# Patient Record
Sex: Female | Born: 2000
Health system: Southern US, Community
[De-identification: ages and names within clinical notes are randomized; demographics above are authoritative.]

## PROBLEM LIST (undated history)

## (undated) DIAGNOSIS — Z720 Tobacco use: Secondary | ICD-10-CM

## (undated) DIAGNOSIS — F419 Anxiety disorder, unspecified: Secondary | ICD-10-CM

## (undated) DIAGNOSIS — F1290 Cannabis use, unspecified, uncomplicated: Secondary | ICD-10-CM

## (undated) DIAGNOSIS — A64 Unspecified sexually transmitted disease: Secondary | ICD-10-CM

## (undated) DIAGNOSIS — R002 Palpitations: Secondary | ICD-10-CM

## (undated) DIAGNOSIS — U071 COVID-19: Secondary | ICD-10-CM

## (undated) DIAGNOSIS — J45909 Unspecified asthma, uncomplicated: Secondary | ICD-10-CM

## (undated) HISTORY — DX: COVID-19: U07.1

## (undated) HISTORY — DX: Cannabis use, unspecified, uncomplicated: F12.90

## (undated) HISTORY — DX: Tobacco use: Z72.0

---
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
## 2018-04-21 DIAGNOSIS — Z30012 Encounter for prescription of emergency contraception: Secondary | ICD-10-CM | POA: Diagnosis not present

## 2018-09-25 DIAGNOSIS — Z3009 Encounter for other general counseling and advice on contraception: Secondary | ICD-10-CM | POA: Diagnosis not present

## 2018-09-25 DIAGNOSIS — Z3042 Encounter for surveillance of injectable contraceptive: Secondary | ICD-10-CM | POA: Diagnosis not present

## 2018-11-19 DIAGNOSIS — Z20828 Contact with and (suspected) exposure to other viral communicable diseases: Secondary | ICD-10-CM | POA: Diagnosis not present

## 2018-12-25 DIAGNOSIS — Z3042 Encounter for surveillance of injectable contraceptive: Secondary | ICD-10-CM | POA: Diagnosis not present

## 2018-12-25 DIAGNOSIS — Z3009 Encounter for other general counseling and advice on contraception: Secondary | ICD-10-CM | POA: Diagnosis not present

## 2019-02-16 DIAGNOSIS — H0102B Squamous blepharitis left eye, upper and lower eyelids: Secondary | ICD-10-CM | POA: Diagnosis not present

## 2019-02-16 DIAGNOSIS — H52223 Regular astigmatism, bilateral: Secondary | ICD-10-CM | POA: Diagnosis not present

## 2019-02-16 DIAGNOSIS — H0102A Squamous blepharitis right eye, upper and lower eyelids: Secondary | ICD-10-CM | POA: Diagnosis not present

## 2019-02-16 DIAGNOSIS — H1045 Other chronic allergic conjunctivitis: Secondary | ICD-10-CM | POA: Diagnosis not present

## 2019-02-16 DIAGNOSIS — H0288A Meibomian gland dysfunction right eye, upper and lower eyelids: Secondary | ICD-10-CM | POA: Diagnosis not present

## 2019-02-16 DIAGNOSIS — H5213 Myopia, bilateral: Secondary | ICD-10-CM | POA: Diagnosis not present

## 2019-02-16 DIAGNOSIS — H0288B Meibomian gland dysfunction left eye, upper and lower eyelids: Secondary | ICD-10-CM | POA: Diagnosis not present

## 2019-03-06 ENCOUNTER — Ambulatory Visit: Payer: Medicaid Other | Attending: Internal Medicine

## 2019-03-06 ENCOUNTER — Other Ambulatory Visit: Payer: Self-pay

## 2019-03-06 DIAGNOSIS — Z20822 Contact with and (suspected) exposure to covid-19: Secondary | ICD-10-CM

## 2019-03-07 LAB — NOVEL CORONAVIRUS, NAA: SARS-CoV-2, NAA: DETECTED — AB

## 2019-03-19 ENCOUNTER — Encounter (HOSPITAL_COMMUNITY): Payer: Self-pay

## 2019-03-19 ENCOUNTER — Ambulatory Visit (HOSPITAL_COMMUNITY)
Admission: EM | Admit: 2019-03-19 | Discharge: 2019-03-19 | Disposition: A | Discharge status: Home / Self Care | Payer: Medicaid Other | Attending: Emergency Medicine | Admitting: Emergency Medicine

## 2019-03-19 ENCOUNTER — Other Ambulatory Visit: Payer: Self-pay

## 2019-03-19 ENCOUNTER — Ambulatory Visit (HOSPITAL_COMMUNITY): Payer: Medicaid Other

## 2019-03-19 DIAGNOSIS — R002 Palpitations: Secondary | ICD-10-CM

## 2019-03-19 DIAGNOSIS — F411 Generalized anxiety disorder: Secondary | ICD-10-CM

## 2019-03-19 MED ORDER — HYDROXYZINE HCL 25 MG PO TABS: 25.0000 mg | ORAL_TABLET | Freq: Four times a day (QID) | ORAL | 0 refills | Status: DC

## 2019-03-19 NOTE — ED Provider Notes (Signed)
[facility]    CSN: [id number] Arrival date & time: 03/19/19  1840      History   Chief Complaint Chief Complaint  Patient presents with  . Positive Covid    HPI Rachael Tanner is a 19 y.o. female significant past medical history presenting today for evaluation of shortness of breath.  Patient states that approximately 45 minutes ago she felt short of breath.  States that she was lying down when symptoms occurred.  She has difficulty describing exactly how she was feeling.  She denied associated chest pain nausea or vomiting.  Denies associated headaches or vision changes.  Patient is approximately 2 weeks post positive Covid test.  Initially she had some nasal congestion and loss of taste and smell, the symptoms have resolved.  She explains her symptoms 1 hour ago as "feeling "light".  She denies dizziness.  States that it may have been a lightheaded sensation.  Afterwards she smoked marijuana before arriving here.  She does report approximately 1 black and mild.  Denies prior DVT/PE.  Denies leg pain or leg swelling.  Denies recent travel/immobilization.  Is on Depo for birth control.  Denies history of hypertension or diabetes.  Does report history of anxiety.  HPI  Past Medical History:  Diagnosis Date  . STD (female)     There are no problems to display for this patient.   History reviewed. No pertinent surgical history.  OB History   No obstetric history on file.      Home Medications    Prior to Admission medications   Medication Sig Start Date End Date Taking? Authorizing Provider  hydrOXYzine (ATARAX/VISTARIL) 25 MG tablet Take 1 tablet (25 mg total) by mouth every 6 (six) hours. 03/19/19   Dyanara Cozza C, PA-C  mupirocin ointment (BACTROBAN) 2 % Apply 1 application topically 2 (two) times daily. 12/25/17   Tanisa Lagace, Junius Creamer, PA-C    Family History No family history on file.  Social History Social History   Tobacco Use  . Smoking status:  Never Smoker  . Smokeless tobacco: Never Used  Substance Use Topics  . Alcohol use: No  . Drug use: Yes    Types: Marijuana     Allergies   Patient has no known allergies.   Review of Systems Review of Systems  Constitutional: Negative for activity change, appetite change, chills, fatigue and fever.  HENT: Negative for congestion, ear pain, rhinorrhea, sinus pressure, sore throat and trouble swallowing.   Eyes: Negative for discharge and redness.  Respiratory: Positive for shortness of breath. Negative for cough and chest tightness.   Cardiovascular: Negative for chest pain.  Gastrointestinal: Negative for abdominal pain, diarrhea, nausea and vomiting.  Musculoskeletal: Negative for myalgias.  Skin: Negative for rash.  Neurological: Negative for dizziness, light-headedness and headaches.     Physical Exam Triage Vital Signs ED Triage Vitals  Enc Vitals Group     BP 03/19/19 1858 124/73     Pulse Rate 03/19/19 1858 (!) 105     Resp 03/19/19 1858 16     Temp --      Temp Source 03/19/19 1858 Oral     SpO2 03/19/19 1858 100 %     Weight 03/19/19 1856 126 lb 8 oz (57.4 kg)     Height --      Head Circumference --      Peak Flow --      Pain Score 03/19/19 1856 7     Pain Loc --  Pain Edu? --      Excl. in GC? --    No data found.  Updated Vital Signs BP 124/73 (BP Location: Right Arm)   Pulse (!) 105   Resp 16   Wt 126 lb 8 oz (57.4 kg)   LMP 02/20/2019   SpO2 100%   Visual Acuity Right Eye Distance:   Left Eye Distance:   Bilateral Distance:    Right Eye Near:   Left Eye Near:    Bilateral Near:     Physical Exam Vitals and nursing note reviewed.  Constitutional:      General: She is not in acute distress.    Appearance: She is well-developed.  HENT:     Head: Normocephalic and atraumatic.     Ears:     Comments: Bilateral ears without tenderness to palpation of external auricle, tragus and mastoid, EAC's without erythema or swelling, TM's  with good bony landmarks and cone of light. Non erythematous.    Mouth/Throat:     Comments: Oral mucosa pink and moist, no tonsillar enlargement or exudate. Posterior pharynx patent and nonerythematous, no uvula deviation or swelling. Normal phonation. Eyes:     Extraocular Movements: Extraocular movements intact.     Conjunctiva/sclera: Conjunctivae normal.     Pupils: Pupils are equal, round, and reactive to light.     Comments: Wearing glasses  Cardiovascular:     Rate and Rhythm: Regular rhythm. Tachycardia present.     Heart sounds: No murmur.  Pulmonary:     Effort: Pulmonary effort is normal. No respiratory distress.     Breath sounds: Normal breath sounds.     Comments: Breathing comfortably at rest, CTABL, no wheezing, rales or other adventitious sounds auscultated  Abdominal:     Palpations: Abdomen is soft.     Tenderness: There is no abdominal tenderness.  Musculoskeletal:     Cervical back: Neck supple.     Comments: Bilateral lower leg symmetric, no calf tenderness or swelling noted  Skin:    General: Skin is warm and dry.  Neurological:     General: No focal deficit present.     Mental Status: She is alert and oriented to person, place, and time. Mental status is at baseline.      UC Treatments / Results  Labs (all labs ordered are listed, but only abnormal results are displayed) Labs Reviewed - No data to display  EKG   Radiology No results found.  Procedures Procedures (including critical care time)  Medications Ordered in UC Medications - No data to display  Initial Impression / Assessment and Plan / UC Course  I have reviewed the triage vital signs and the nursing notes.  Pertinent labs & imaging results that were available during my care of the patient were reviewed by me and considered in my medical decision making (see chart for details).    Unable to obtain clear description of patient's symptoms earlier today.  Given recent Covid and  reported varying breathing with tachycardia initially offered chest x-ray.  Patient became very anxious and concerned around this so this was deferred.  Oxygen 100%, feel pneumonia less likely.  EKG is obtained, repeat EKG still tachycardic, but reassuring without any acute signs of ischemia or infarction or arrhythmia.  Negative risk factors for ACS, PERC negative.  At this time will treat for anxiety with hydroxyzine as needed, but advised patient if she is developing any chest pain or issues with breathing to follow-up given recent Covid infection  and possible increased coagulation risk.  Discussed strict return precautions. Patient verbalized understanding and is agreeable with plan.   Final Clinical Impressions(s) / UC Diagnoses   Final diagnoses:  Palpitations  Anxiety state     Discharge Instructions     Please use hydroxyzine as needed for anxiety  If you are developing shortness of breath, difficulty breathing, chest pain, leg pain or leg swelling, increased lightheadedness or dizziness please follow-up in the emergency room    ED Prescriptions    Medication Sig Dispense Auth. Provider   hydrOXYzine (ATARAX/VISTARIL) 25 MG tablet Take 1 tablet (25 mg total) by mouth every 6 (six) hours. 24 tablet Tarshia Kot, Chili C, PA-C     PDMP not reviewed this encounter.   Ramatoulaye Pack, [place] C, PA-C 03/19/19 2025

## 2019-03-19 NOTE — ED Triage Notes (Signed)
Pt state she has SOB this started 45 minutes ago. Pt was just told that she was Covid Positive.

## 2019-03-19 NOTE — Discharge Instructions (Signed)
Please use hydroxyzine as needed for anxiety  If you are developing shortness of breath, difficulty breathing, chest pain, leg pain or leg swelling, increased lightheadedness or dizziness please follow-up in the emergency room

## 2019-03-20 ENCOUNTER — Emergency Department (HOSPITAL_COMMUNITY)
Admission: EM | Admit: 2019-03-20 | Discharge: 2019-03-20 | Disposition: A | Discharge status: Home / Self Care | Payer: Medicaid Other | Attending: Emergency Medicine | Admitting: Emergency Medicine

## 2019-03-20 ENCOUNTER — Other Ambulatory Visit: Payer: Self-pay

## 2019-03-20 DIAGNOSIS — U071 COVID-19: Secondary | ICD-10-CM | POA: Insufficient documentation

## 2019-03-20 DIAGNOSIS — F419 Anxiety disorder, unspecified: Secondary | ICD-10-CM | POA: Diagnosis not present

## 2019-03-20 DIAGNOSIS — R0602 Shortness of breath: Secondary | ICD-10-CM | POA: Diagnosis present

## 2019-03-20 NOTE — ED Provider Notes (Addendum)
[place] EMERGENCY DEPARTMENT Provider Note   CSN: [id number] Arrival date & time: 03/20/19  1255     History Chief Complaint  Patient presents with  . covid positive  . Shortness of Breath    Rachael Tanner is a 19 y.o. female presented to emergency department today with chief complaint of intermittent shortness of breath x2 days.  She is also endorsing increased anxiety lately.  She has never been officially diagnosed with this but states that she is recently under a lot of stress. Patient was diagnosed with Covid on 03/08/2019.  She states her only symptoms were loss of sense of taste and smell. She was seen at urgent care yesterday and given prescription for vistaril prn for anxiety. She has not yet picked up prescription from pharmacy, no medications for symptoms prior to arrival. She is unsure if she is experiencing shortness of breath because she has never had it before. She denies any suicidal or homicidal ideations.  She denies any associated chest pain, dyspnea on exertion, syncope, palpitations, numbness, weakness, fever, chills, cough, congestion, abdominal pain, nausea, vomiting, urinary symptoms, diarrhea.    Past Medical History:  Diagnosis Date  . STD (female)     There are no problems to display for this patient.   No past surgical history on file.   OB History   No obstetric history on file.     No family history on file.  Social History   Tobacco Use  . Smoking status: Never Smoker  . Smokeless tobacco: Never Used  Substance Use Topics  . Alcohol use: No  . Drug use: Yes    Types: Marijuana    Home Medications Prior to Admission medications   Medication Sig Start Date End Date Taking? Authorizing Provider  hydrOXYzine (ATARAX/VISTARIL) 25 MG tablet Take 1 tablet (25 mg total) by mouth every 6 (six) hours. 03/19/19   Wieters, Hallie C, PA-C  mupirocin ointment (BACTROBAN) 2 % Apply 1 application topically 2 (two) times daily.  12/25/17   Wieters, Hallie C, PA-C    Allergies    Patient has no known allergies.  Review of Systems   Review of Systems  All other systems are reviewed and are negative for acute change except as noted in the HPI.   Physical Exam Updated Vital Signs BP 115/78   Pulse 89   Temp 98.6 F (37 C) (Oral)   Resp 18   LMP 02/20/2019   SpO2 100%   Physical Exam Vitals and nursing note reviewed.  Constitutional:      Appearance: She is well-developed. She is not ill-appearing or toxic-appearing.  HENT:     Head: Normocephalic and atraumatic.     Nose: Nose normal.  Eyes:     General: No scleral icterus.       Right eye: No discharge.        Left eye: No discharge.     Conjunctiva/sclera: Conjunctivae normal.  Neck:     Vascular: No JVD.  Cardiovascular:     Rate and Rhythm: Normal rate and regular rhythm.     Pulses: Normal pulses.     Heart sounds: Normal heart sounds.  Pulmonary:     Effort: Pulmonary effort is normal.     Breath sounds: Normal breath sounds.     Comments: Lungs are clear to auscultation all fields, symmetric chest rise, normal work of breathing, no wheezing rales or rhonchi heard.  SPO2 is 100% on room air during interview and  exam. Chest:     Chest wall: No tenderness.  Abdominal:     General: There is no distension.  Musculoskeletal:        General: Normal range of motion.     Cervical back: Normal range of motion.  Lymphadenopathy:     Cervical: Cervical adenopathy:    Skin:    General: Skin is warm and dry.     Capillary Refill: Capillary refill takes less than 2 seconds.  Neurological:     Mental Status: She is oriented to person, place, and time.     GCS: GCS eye subscore is 4. GCS verbal subscore is 5. GCS motor subscore is 6.     Comments: Fluent speech, no facial droop.  Psychiatric:        Mood and Affect: Mood is anxious.        Behavior: Behavior normal.       ED Results / Procedures / Treatments   Labs (all labs ordered are  listed, but only abnormal results are displayed) Labs Reviewed - No data to display  EKG None  Radiology No results found.  Procedures Procedures (including critical care time)  Medications Ordered in ED Medications - No data to display Vitals:   03/20/19 1330 03/20/19 1345 03/20/19 1400 03/20/19 1415  BP: 121/89 101/71 107/71 112/72  Pulse: (!) 102 99 80 85  Resp:      Temp:      TempSrc:      SpO2: 97% 98% 98% 97%    ED Course  I have reviewed the triage vital signs and the nursing notes.  Pertinent labs & imaging results that were available during my care of the patient were reviewed by me and considered in my medical decision making (see chart for details).    MDM Rules/Calculators/A&P                      Patient is very well-appearing, in no acute distress.  Vital signs are stable.  No tachycardia or hypoxia noted.  Lungs are clear to auscultation all fields, normal respiratory exam.  Patient does appear anxious.  Her symptoms are suggestive of anxiety.  She was already prescribed Vistaril by urgent care which she plans to pick up on her way home today.  She ambulated in the emergency department with SpO2 over 99% on room air no signs of respiratory distress. Given her reassuring exam, normal work of breathing and vital signs feel that she can be discharged home with symptomatic care  Doubt need for further emergent work up at this time. I explained the diagnosis and have given explicit precautions to return to the ER including for any other new or worsening symptoms. The patient understands and accepts the medical plan as it's been dictated and I have answered their questions. Discharge instructions concerning home care and prescriptions have been given. The patient is STABLE and is discharged to home in good condition. Findings and plan of care discussed with supervising physician Dr. Hyacinth Meeker who agrees with plan of care.   Portions of this note were generated with Administrator, sports. Dictation errors may occur despite best attempts at proofreading.   Rachael Tanner was evaluated in Emergency Department on 03/20/2019 for the symptoms described in the history of present illness. She was evaluated in the context of the global COVID-19 pandemic, which necessitated consideration that the patient might be at risk for infection with the SARS-CoV-2 virus that causes COVID-19. Institutional protocols  and algorithms that pertain to the evaluation of patients at risk for COVID-19 are in a state of rapid change based on information released by regulatory bodies including the CDC and federal and state organizations. These policies and algorithms were followed during the patient's care in the ED.   Final Clinical Impression(s) / ED Diagnoses Final diagnoses:  Anxiety    Rx / DC Orders ED Discharge Orders    None       Sherene Sires, PA-C 03/20/19 1817    Sherene Sires, PA-C 03/20/19 1817    Eber Hong, MD 03/24/19 [phone]

## 2019-03-20 NOTE — Discharge Instructions (Addendum)
You have been seen today for shortness of breath. Please read and follow all provided instructions. Return to the emergency room for worsening condition or new concerning symptoms.     We suspect your shortness of breath was related to anxiety.  Suspect this is related to  1. Medications:  Recommend  you take the Vistaril as urgent care directed.  Continue usual home medications Take medications as prescribed. Please review all of the medicines and only take them if you do not have an allergy to them.   2. Treatment: rest, drink plenty of fluids  3. Follow Up:  Please follow up with primary care provider by scheduling an appointment as soon as possible for a visit  If you do not have a primary care physician, contact [facility] at [phone] for referral   It is also a possibility that you have an allergic reaction to any of the medicines that you have been prescribed - Everybody reacts differently to medications and while MOST people have no trouble with most medicines, you may have a reaction such as nausea, vomiting, rash, swelling, shortness of breath. If this is the case, please stop taking the medicine immediately and contact your physician.  ?

## 2019-03-20 NOTE — ED Triage Notes (Signed)
Dx with covid + January 30th, was seen at urgent care for shortness of breath yesterday-- now is still feeling "different" pt in no acute distress.

## 2019-03-20 NOTE — ED Notes (Signed)
Pt ambulated around the room, O2 sats 99-100 the whole time.

## 2019-03-22 ENCOUNTER — Emergency Department (HOSPITAL_COMMUNITY): Payer: Medicaid Other

## 2019-03-22 ENCOUNTER — Other Ambulatory Visit: Payer: Self-pay

## 2019-03-22 ENCOUNTER — Emergency Department (HOSPITAL_COMMUNITY)
Admission: EM | Admit: 2019-03-22 | Discharge: 2019-03-22 | Disposition: A | Discharge status: Home / Self Care | Payer: Medicaid Other | Attending: Emergency Medicine | Admitting: Emergency Medicine

## 2019-03-22 ENCOUNTER — Encounter (HOSPITAL_COMMUNITY): Payer: Self-pay | Admitting: Emergency Medicine

## 2019-03-22 DIAGNOSIS — R0602 Shortness of breath: Secondary | ICD-10-CM | POA: Diagnosis not present

## 2019-03-22 DIAGNOSIS — F419 Anxiety disorder, unspecified: Secondary | ICD-10-CM | POA: Diagnosis not present

## 2019-03-22 DIAGNOSIS — R0789 Other chest pain: Secondary | ICD-10-CM | POA: Insufficient documentation

## 2019-03-22 DIAGNOSIS — R0600 Dyspnea, unspecified: Secondary | ICD-10-CM

## 2019-03-22 DIAGNOSIS — R05 Cough: Secondary | ICD-10-CM | POA: Diagnosis not present

## 2019-03-22 HISTORY — DX: Anxiety disorder, unspecified: F41.9

## 2019-03-22 LAB — CBC WITH DIFFERENTIAL/PLATELET
Abs Immature Granulocytes: 0.01 10*3/uL (ref 0.00–0.07)
Basophils Absolute: 0 10*3/uL (ref 0.0–0.1)
Basophils Relative: 1 %
Eosinophils Absolute: 0.1 10*3/uL (ref 0.0–0.5)
Eosinophils Relative: 2 %
HCT: 42.6 % (ref 36.0–46.0)
Hemoglobin: 14.2 g/dL (ref 12.0–15.0)
Immature Granulocytes: 0 %
Lymphocytes Relative: 46 %
Lymphs Abs: 1.5 10*3/uL (ref 0.7–4.0)
MCH: 28.3 pg (ref 26.0–34.0)
MCHC: 33.3 g/dL (ref 30.0–36.0)
MCV: 85 fL (ref 80.0–100.0)
Monocytes Absolute: 0.3 10*3/uL (ref 0.1–1.0)
Monocytes Relative: 9 %
Neutro Abs: 1.4 10*3/uL — ABNORMAL LOW (ref 1.7–7.7)
Neutrophils Relative %: 42 %
Platelets: 202 10*3/uL (ref 150–400)
RBC: 5.01 MIL/uL (ref 3.87–5.11)
RDW: 11.8 % (ref 11.5–15.5)
WBC: 3.4 10*3/uL — ABNORMAL LOW (ref 4.0–10.5)
nRBC: 0 % (ref 0.0–0.2)

## 2019-03-22 LAB — BASIC METABOLIC PANEL
Anion gap: 11 (ref 5–15)
BUN: 8 mg/dL (ref 6–20)
CO2: 23 mmol/L (ref 22–32)
Calcium: 8.9 mg/dL (ref 8.9–10.3)
Chloride: 105 mmol/L (ref 98–111)
Creatinine, Ser: 0.78 mg/dL (ref 0.44–1.00)
GFR calc Af Amer: >60 mL/min (ref >60)
GFR calc non Af Amer: >60 mL/min (ref >60)
Glucose, Bld: 81 mg/dL (ref 70–99)
Potassium: 3.4 mmol/L — ABNORMAL LOW (ref 3.5–5.1)
Sodium: 139 mmol/L (ref 135–145)

## 2019-03-22 LAB — D-DIMER, QUANTITATIVE (NOT AT ARMC): D-Dimer, Quant: <0.27 ug/mL-FEU (ref 0.00–0.50)

## 2019-03-22 MED ORDER — AEROCHAMBER PLUS FLO-VU MEDIUM MISC: 1.0000 | Freq: Once | 0 refills | Status: AC

## 2019-03-22 MED ORDER — ALBUTEROL SULFATE HFA 108 (90 BASE) MCG/ACT IN AERS: 2.0000 | INHALATION_SPRAY | RESPIRATORY_TRACT | 0 refills | Status: DC | PRN

## 2019-03-22 MED ORDER — LORAZEPAM 1 MG PO TABS
1.0000 mg | ORAL_TABLET | Freq: Once | ORAL | Status: AC
Start: 2019-03-22 — End: 2019-03-22
  Administered 2019-03-22: 1 mg via ORAL
  Filled 2019-03-22: qty 1

## 2019-03-22 NOTE — Discharge Instructions (Addendum)
Return here as needed.  You do need to follow-up with a primary doctor soon as possible to further discuss chronic management of your anxiety.

## 2019-03-22 NOTE — ED Provider Notes (Signed)
[facility] EMERGENCY DEPARTMENT Provider Note   CSN: [id number] Arrival date & time: 03/22/19  1927     History Chief Complaint  Patient presents with  . Shortness of Breath    Rachael Tanner is a 19 y.o. female.  HPI Patient presents to the emergency department with increasing anxiety and states that she feels like her breathing is abnormal but cannot describe it totally for me.  Patient states she is unsure if it is deeper or shallower.  She states she had a dryness in her throat along with a discomfort she felt in her chest did not last more than a minute.  The patient states that nothing seems to make the condition better or worse.  The patient states that she is feeling resolution of her symptoms from Covid.  Patient states that she feels like her anxiety has gotten worse over the last few days.  The patient states that she was seen in urgent care and given medications which she states seemed to help some.  The patient denies chest pain, headache,blurred vision, neck pain, fever, cough, weakness, numbness, dizziness, anorexia, edema, abdominal pain, nausea, vomiting, diarrhea, rash, back pain, dysuria, hematemesis, bloody stool, near syncope, or syncope.    Past Medical History:  Diagnosis Date  . Anxiety   . STD (female)     There are no problems to display for this patient.   History reviewed. No pertinent surgical history.   OB History   No obstetric history on file.     No family history on file.  Social History   Tobacco Use  . Smoking status: Never Smoker  . Smokeless tobacco: Never Used  Substance Use Topics  . Alcohol use: No  . Drug use: Yes    Types: Marijuana    Home Medications Prior to Admission medications   Medication Sig Start Date End Date Taking? Authorizing Provider  hydrOXYzine (ATARAX/VISTARIL) 25 MG tablet Take 1 tablet (25 mg total) by mouth every 6 (six) hours. 03/19/19   Wieters, Hallie C, PA-C  mupirocin ointment  (BACTROBAN) 2 % Apply 1 application topically 2 (two) times daily. 12/25/17   Wieters, Hallie C, PA-C    Allergies    Patient has no known allergies.  Review of Systems   Review of Systems All other systems negative except as documented in the HPI. All pertinent positives and negatives as reviewed in the HPI. Physical Exam Updated Vital Signs BP 123/70 (BP Location: Left Arm)   Pulse 89   Temp 98.5 F (36.9 C) (Oral)   Resp 18   Ht 5\' 5"  (1.651 m)   Wt 57 kg   LMP 02/20/2019   SpO2 100%   BMI 20.91 kg/m   Physical Exam Vitals and nursing note reviewed.  Constitutional:      General: She is not in acute distress.    Appearance: She is well-developed.  HENT:     Head: Normocephalic and atraumatic.  Eyes:     Pupils: Pupils are equal, round, and reactive to light.  Cardiovascular:     Rate and Rhythm: Normal rate and regular rhythm.     Heart sounds: Normal heart sounds. No murmur. No friction rub. No gallop.   Pulmonary:     Effort: Pulmonary effort is normal. No respiratory distress.     Breath sounds: Normal breath sounds. No wheezing.  Abdominal:     General: Bowel sounds are normal. There is no distension.     Palpations: Abdomen is  soft.     Tenderness: There is no abdominal tenderness.  Musculoskeletal:     Cervical back: Normal range of motion and neck supple.  Skin:    General: Skin is warm and dry.     Capillary Refill: Capillary refill takes less than 2 seconds.     Findings: No erythema or rash.  Neurological:     Mental Status: She is alert and oriented to person, place, and time.     Motor: No abnormal muscle tone.     Coordination: Coordination normal.  Psychiatric:        Mood and Affect: Mood is anxious.        Behavior: Behavior normal.     ED Results / Procedures / Treatments   Labs (all labs ordered are listed, but only abnormal results are displayed) Labs Reviewed  CBC WITH DIFFERENTIAL/PLATELET - Abnormal; Notable for the following  components:      Result Value   WBC 3.4 (*)    Neutro Abs 1.4 (*)    All other components within normal limits  BASIC METABOLIC PANEL - Abnormal; Notable for the following components:   Potassium 3.4 (*)    All other components within normal limits  D-DIMER, QUANTITATIVE (NOT AT [facility])    EKG None  Radiology DG Chest Port 1 View  Result Date: 03/22/2019 CLINICAL DATA:  Shortness of breath cough, sore throat. EXAM: PORTABLE CHEST 1 VIEW COMPARISON:  Chest x-ray dated 09/28/2013. FINDINGS: The heart size and mediastinal contours are within normal limits. Both lungs are clear. The visualized skeletal structures are unremarkable. IMPRESSION: Normal chest x-ray.  No evidence of pneumonia. Electronically Signed   By: Franki Cabot M.D.   On: 03/22/2019 20:22    Procedures Procedures (including critical care time)  Medications Ordered in ED Medications  LORazepam (ATIVAN) tablet 1 mg (has no administration in time range)    ED Course  I have reviewed the triage vital signs and the nursing notes.  Pertinent labs & imaging results that were available during my care of the patient were reviewed by me and considered in my medical decision making (see chart for details).    MDM Rules/Calculators/A&P                      Discussed the patient's findings on her laboratory testing along with x-ray.  Advised the patient that at this point I feel that Covid is not causing her main symptoms.  She agrees and states that she feels like her anxiety is the main cause of what she is experiencing now.  The patient states that she is going to look for a primary doctor first thing Monday but she feels like this needs to be addressed.  The patient was given hydroxyzine at the urgent care for her anxiety.  I advised the patient to return here for any worsening in her condition.  But tonight so far feel her laboratory testing yields no significant findings.  I do not feel that the patient is having a  significant acute event related to any upper respiratory infection or other significant issue such as pulmonary embolus or ACS. Final Clinical Impression(s) / ED Diagnoses Final diagnoses:  None    Rx / DC Orders ED Discharge Orders    None       Rebeca Allegra 03/22/19 2117    Charlesetta Shanks, MD 03/23/19 1657

## 2019-03-22 NOTE — ED Triage Notes (Signed)
Pt c/o shob since Thursday with dry/sore throat, denies cough, denies fever, denies n/v. Pt dx with COVID 03/06/19

## 2019-03-30 DIAGNOSIS — Z3009 Encounter for other general counseling and advice on contraception: Secondary | ICD-10-CM | POA: Diagnosis not present

## 2019-03-30 DIAGNOSIS — Z3042 Encounter for surveillance of injectable contraceptive: Secondary | ICD-10-CM | POA: Diagnosis not present

## 2019-04-06 ENCOUNTER — Encounter: Payer: Self-pay | Admitting: Internal Medicine

## 2019-04-06 ENCOUNTER — Other Ambulatory Visit: Payer: Self-pay

## 2019-04-06 ENCOUNTER — Ambulatory Visit: Payer: Medicaid Other | Attending: Internal Medicine | Admitting: Internal Medicine

## 2019-04-06 DIAGNOSIS — F411 Generalized anxiety disorder: Secondary | ICD-10-CM | POA: Diagnosis not present

## 2019-04-06 NOTE — Progress Notes (Signed)
Virtual Visit via Telephone Note Due to current restrictions/limitations of in-office visits due to the COVID-19 pandemic, this scheduled clinical appointment was converted to a telehealth visit  I connected with Rachael Tanner on 04/06/19 at 2:36 p.m by telephone and verified that I am speaking with the correct person using two identifiers. I am in my office.  The patient is at home.  Only the patient and myself participated in this encounter.  I discussed the limitations, risks, security and privacy concerns of performing an evaluation and management service by telephone and the availability of in person appointments. I also discussed with the patient that there may be a patient responsible charge related to this service. The patient expressed understanding and agreed to proceed.   History of Present Illness: This is new pt visit and f/u from [facility]. No previous PCP.  Pt c/o having very bad anxiety x several yrs. Started after "I was going through stuff at a young age and felt like I really could not talk to anyone about it."  Worse after her boyfriend was killed/shot in 2017 and recently after being dx with covid.  -up to this pt, she never had a counselor and was not on med. Seen in UC several wks with anxiety and sob.  Placed on Hydroxyzine and took one.  Did help but 2nd time she took it, she felt it did not help. -Feels she gets anxious in her head and very alert on what's going on in her body.   Has appt later this wk with a therapist.  She does not want to take meds, she feels she will do alright by just talking to someone.  "I just need to figure out a way to cope with it."  -has had some wgh gain.  No loss    Outpatient Encounter Medications as of 04/06/2019  Medication Sig  . albuterol (VENTOLIN HFA) 108 (90 Base) MCG/ACT inhaler Inhale 2 puffs into the lungs every 4 (four) hours as needed for wheezing or shortness of breath. (Patient not taking: Reported on 04/06/2019)  . hydrOXYzine  (ATARAX/VISTARIL) 25 MG tablet Take 1 tablet (25 mg total) by mouth every 6 (six) hours. (Patient not taking: Reported on 04/06/2019)  . mupirocin ointment (BACTROBAN) 2 % Apply 1 application topically 2 (two) times daily. (Patient not taking: Reported on 04/06/2019)   No facility-administered encounter medications on file as of 04/06/2019.    Observations/Objective: GAD 7 : Generalized Anxiety Score 04/06/2019  Nervous, Anxious, on Edge 1  Control/stop worrying 3  Worry too much - different things 3  Trouble relaxing 1  Restless 1  Easily annoyed or irritable 3  Afraid - awful might happen 1  Total GAD 7 Score 13    Depression screen PHQ 2/9 04/06/2019  Decreased Interest 1  Down, Depressed, Hopeless 1  PHQ - 2 Score 2  Altered sleeping 1  Tired, decreased energy 0  Change in appetite 1  Feeling bad or failure about yourself  1  Trouble concentrating 0  Moving slowly or fidgety/restless 0  Suicidal thoughts 0  PHQ-9 Score 5    Assessment and Plan: 1. Generalized anxiety disorder -Commended her on seeking out a therapist on encourage her to keep the appointment which she states she has later this week.  Discussed ways to help decrease anxiety including deep breathing exercises regular exercise, getting in 7 to 9 hours of sleep at night. -If after having a few sessions with the therapist she feels that she may  need medication in addition to the therapy sessions she will let me know.   Follow Up Instructions: As needed.   I discussed the assessment and treatment plan with the patient. The patient was provided an opportunity to ask questions and all were answered. The patient agreed with the plan and demonstrated an understanding of the instructions.   The patient was advised to call back or seek an in-person evaluation if the symptoms worsen or if the condition fails to improve as anticipated.  I provided 14 minutes of non-face-to-face time during this encounter.   Karle Plumber, MD

## 2019-04-06 NOTE — Progress Notes (Signed)
Hospital F /u   Made MD aware of GAD 7

## 2019-05-12 DIAGNOSIS — H5213 Myopia, bilateral: Secondary | ICD-10-CM | POA: Diagnosis not present

## 2019-06-10 ENCOUNTER — Ambulatory Visit (HOSPITAL_COMMUNITY)
Admission: EM | Admit: 2019-06-10 | Discharge: 2019-06-10 | Disposition: A | Discharge status: Home / Self Care | Payer: Medicaid Other | Attending: Urgent Care | Admitting: Urgent Care

## 2019-06-10 ENCOUNTER — Encounter (HOSPITAL_COMMUNITY): Payer: Self-pay

## 2019-06-10 ENCOUNTER — Other Ambulatory Visit: Payer: Self-pay

## 2019-06-10 DIAGNOSIS — R0789 Other chest pain: Secondary | ICD-10-CM | POA: Diagnosis not present

## 2019-06-10 LAB — TSH: TSH: 2.389 u[IU]/mL (ref 0.350–4.500)

## 2019-06-10 MED ORDER — NAPROXEN 500 MG PO TABS
500.0000 mg | ORAL_TABLET | Freq: Two times a day (BID) | ORAL | 0 refills | Status: DC
Start: 2019-06-10 — End: 2019-09-30

## 2019-06-10 NOTE — ED Provider Notes (Signed)
[facility]   MRN: [id number] DOB: 11/06/2000  Subjective:   Rachael Tanner is a 19 y.o. female presenting for acute onset of intermittent mild left-sided chest pain.  Symptoms started without any known inciting factor.  Has not taken any medications for relief.  Patient states that since she got COVID-19 in January she has had intermittent chest pains and has been very anxious about her health.  Denies history of asthma, musculoskeletal disorder.  No current facility-administered medications for this encounter.  Current Outpatient Medications:  .  albuterol (VENTOLIN HFA) 108 (90 Base) MCG/ACT inhaler, Inhale 2 puffs into the lungs every 4 (four) hours as needed for wheezing or shortness of breath. (Patient not taking: Reported on 04/06/2019), Disp: 3.7 g, Rfl: 0 .  hydrOXYzine (ATARAX/VISTARIL) 25 MG tablet, Take 1 tablet (25 mg total) by mouth every 6 (six) hours. (Patient not taking: Reported on 04/06/2019), Disp: 24 tablet, Rfl: 0 .  mupirocin ointment (BACTROBAN) 2 %, Apply 1 application topically 2 (two) times daily. (Patient not taking: Reported on 04/06/2019), Disp: 22 g, Rfl: 0   No Known Allergies  Past Medical History:  Diagnosis Date  . Anxiety   . STD (female)      History reviewed. No pertinent surgical history.  History reviewed. No pertinent family history.  Social History   Tobacco Use  . Smoking status: Never Smoker  . Smokeless tobacco: Never Used  Substance Use Topics  . Alcohol use: No  . Drug use: Yes    Types: Marijuana    ROS   Objective:   Vitals: BP 115/72 (BP Location: Right Arm)   Pulse 88   Temp 99.1 F (37.3 C) (Oral)   Resp 18   SpO2 98%   Physical Exam Constitutional:      General: She is not in acute distress.    Appearance: Normal appearance. She is well-developed. She is not ill-appearing, toxic-appearing or diaphoretic.  HENT:     Head: Normocephalic and atraumatic.     Nose: Nose normal.     Mouth/Throat:     Mouth:  Mucous membranes are moist.  Eyes:     Extraocular Movements: Extraocular movements intact.     Pupils: Pupils are equal, round, and reactive to light.  Cardiovascular:     Rate and Rhythm: Normal rate and regular rhythm.     Pulses: Normal pulses.     Heart sounds: Normal heart sounds. No murmur. No friction rub. No gallop.   Pulmonary:     Effort: Pulmonary effort is normal. No respiratory distress.     Breath sounds: Normal breath sounds. No stridor. No wheezing, rhonchi or rales.  Skin:    General: Skin is warm and dry.     Findings: No rash.  Neurological:     Mental Status: She is alert and oriented to person, place, and time.  Psychiatric:        Mood and Affect: Mood normal.        Thought Content: Thought content normal.     Comments: Patient texting throughout visit and had 2 phone calls while I was in the room.     ED ECG REPORT   Date: 06/10/2019  Rate: 79bpm  Rhythm: sinus arrhythmia  QRS Axis: normal  Intervals: normal  ST/T Wave abnormalities: normal  Conduction Disutrbances:none  Narrative Interpretation: Sinus arrhythmia (pvc, pac) at 79bpm. PVCs, pacs new compared to previous ecg.   Old EKG Reviewed: changes noted  I have personally reviewed the EKG  tracing and agree with the computerized printout as noted.   Assessment and Plan :   PDMP not reviewed this encounter.  1. Atypical chest pain     I tried to inform patient about her EKG findings but was difficult given patient being on the phone.  No acute process suspected, vital signs stable for discharge.  Recommended naproxen for musculoskeletal type pain.  Follow-up with PCP. Counseled patient on potential for adverse effects with medications prescribed/recommended today, ER and return-to-clinic precautions discussed, patient verbalized understanding.    Jaynee Eagles, [place] 06/10/19 1921

## 2019-06-10 NOTE — Discharge Instructions (Signed)
Today, we are obtaining blood work to check your thyroid gland.  This is a gland in your body that sits on your throat and can cause symptoms of heart racing, chest pain and anxiety.  I do encourage you to continue working with your new primary care provider on why you are having chest pain.  You may end up needing a referral to a heart doctor from them.  For now, I recommend using Tylenol with or without naproxen for any kind of musculoskeletal pains that you have including your chest pain.

## 2019-06-10 NOTE — ED Triage Notes (Signed)
Pt has Hx of anxiety.

## 2019-06-10 NOTE — ED Triage Notes (Signed)
Pt presents with left side chest pain on the side of and under the breast area just today.

## 2019-06-10 NOTE — ED Notes (Signed)
Eval pt in waiting room, pt on phone. C/o acute onset "sharp" pain to left/mid chest this morning that increases with breathing/coughing. Denies SOB, n/v, dizziness, diaphoresis or radiating pain. Able to speak full sentences w/o difficulty. SPO2 96% on RA HR 86. Pt to registration and notified triage RN for triage of pt. Pt instructed to inform staff of any increase in symptoms, SOB, n/v, etc.

## 2019-06-16 ENCOUNTER — Telehealth (HOSPITAL_COMMUNITY): Payer: Self-pay

## 2019-06-16 NOTE — Telephone Encounter (Signed)
Pt called for TSH result. Result given and explained to pt that Wallis Bamberg PA had messaged pt through [place] to inform pt that result was negative and to follow up as needed.

## 2019-06-23 ENCOUNTER — Ambulatory Visit: Payer: Medicaid Other | Admitting: Family

## 2019-06-23 ENCOUNTER — Other Ambulatory Visit: Payer: Self-pay

## 2019-06-23 NOTE — Progress Notes (Deleted)
Patient ID: Rachael Tanner, female    DOB: 30-Apr-2000  MRN: [id number]  CC: No chief complaint on file.   Subjective: Rachael Tanner is a 19 y.o. female who presents for Her concerns today include: ***  There are no problems to display for this patient.  1. CHEST PAIN FOLLOW-UP: Location:  Description:  Onset:   Symptoms Trauma:  Nausea/vomiting:  Diaphoresis:  Shortness of breath:  Cough:  Edema:  Orthopnea:  Syncope:  Indigestion:   Red Flags Worse with exertion:  Recent Immobility:  Cancer history:  Tearing/radiation to back:    Current Outpatient Medications on File Prior to Visit  Medication Sig Dispense Refill  . albuterol (VENTOLIN HFA) 108 (90 Base) MCG/ACT inhaler Inhale 2 puffs into the lungs every 4 (four) hours as needed for wheezing or shortness of breath. (Patient not taking: Reported on 04/06/2019) 3.7 g 0  . hydrOXYzine (ATARAX/VISTARIL) 25 MG tablet Take 1 tablet (25 mg total) by mouth every 6 (six) hours. (Patient not taking: Reported on 04/06/2019) 24 tablet 0  . mupirocin ointment (BACTROBAN) 2 % Apply 1 application topically 2 (two) times daily. (Patient not taking: Reported on 04/06/2019) 22 g 0  . naproxen (NAPROSYN) 500 MG tablet Take 1 tablet (500 mg total) by mouth 2 (two) times daily with a meal. 30 tablet 0   No current facility-administered medications on file prior to visit.    No Known Allergies  Social History   Socioeconomic History  . Marital status: Single    Spouse name: Not on file  . Number of children: Not on file  . Years of education: Not on file  . Highest education level: Not on file  Occupational History  . Not on file  Tobacco Use  . Smoking status: Never Smoker  . Smokeless tobacco: Never Used  Substance and Sexual Activity  . Alcohol use: No  . Drug use: Yes    Types: Marijuana  . Sexual activity: Not on file  Other Topics Concern  . Not on file  Social History Narrative  . Not on file   Social  Determinants of Health   Financial Resource Strain:   . Difficulty of Paying Living Expenses:   Food Insecurity:   . Worried About Programme researcher, broadcasting/film/video in the Last Year:   . Barista in the Last Year:   Transportation Needs:   . Freight forwarder (Medical):   Marland Kitchen Lack of Transportation (Non-Medical):   Physical Activity:   . Days of Exercise per Week:   . Minutes of Exercise per Session:   Stress:   . Feeling of Stress :   Social Connections:   . Frequency of Communication with Friends and Family:   . Frequency of Social Gatherings with Friends and Family:   . Attends Religious Services:   . Active Member of Clubs or Organizations:   . Attends Banker Meetings:   Marland Kitchen Marital Status:   Intimate Partner Violence:   . Fear of Current or Ex-Partner:   . Emotionally Abused:   Marland Kitchen Physically Abused:   . Sexually Abused:     No family history on file.  No past surgical history on file.  ROS: Review of Systems Negative except as stated above  PHYSICAL EXAM: There were no vitals taken for this visit.  Physical Exam  {female adult master:310786} {female adult master:310785}  CMP Latest Ref Rng & Units 03/22/2019  Glucose 70 - 99 mg/dL 81  BUN  6 - 20 mg/dL 8  Creatinine 0.44 - 1.00 mg/dL 0.78  Sodium 135 - 145 mmol/L 139  Potassium 3.5 - 5.1 mmol/L 3.4(L)  Chloride 98 - 111 mmol/L 105  CO2 22 - 32 mmol/L 23  Calcium 8.9 - 10.3 mg/dL 8.9   Lipid Panel  No results found for: CHOL, TRIG, HDL, CHOLHDL, VLDL, LDLCALC, LDLDIRECT  CBC    Component Value Date/Time   WBC 3.4 (L) 03/22/2019 2018   RBC 5.01 03/22/2019 2018   HGB 14.2 03/22/2019 2018   HCT 42.6 03/22/2019 2018   PLT 202 03/22/2019 2018   MCV 85.0 03/22/2019 2018   MCH 28.3 03/22/2019 2018   MCHC 33.3 03/22/2019 2018   RDW 11.8 03/22/2019 2018   LYMPHSABS 1.5 03/22/2019 2018   MONOABS 0.3 03/22/2019 2018   EOSABS 0.1 03/22/2019 2018   BASOSABS 0.0 03/22/2019 2018    ASSESSMENT AND  PLAN:  There are no diagnoses linked to this encounter.   Patient was given the opportunity to ask questions.  Patient verbalized understanding of the plan and was able to repeat key elements of the plan.   No orders of the defined types were placed in this encounter.    Requested Prescriptions    No prescriptions requested or ordered in this encounter    No follow-ups on file.  Rachael Herter, NP

## 2019-06-29 ENCOUNTER — Ambulatory Visit: Payer: Medicaid Other | Attending: Family | Admitting: Family

## 2019-06-29 ENCOUNTER — Other Ambulatory Visit: Payer: Self-pay

## 2019-06-29 ENCOUNTER — Encounter: Payer: Self-pay | Admitting: Family

## 2019-06-29 VITALS — BP 125/78 | HR 81 | Temp 97.7°F | Resp 16 | Wt 129.4 lb

## 2019-06-29 DIAGNOSIS — Z79899 Other long term (current) drug therapy: Secondary | ICD-10-CM | POA: Insufficient documentation

## 2019-06-29 DIAGNOSIS — Z791 Long term (current) use of non-steroidal anti-inflammatories (NSAID): Secondary | ICD-10-CM | POA: Diagnosis not present

## 2019-06-29 DIAGNOSIS — Z09 Encounter for follow-up examination after completed treatment for conditions other than malignant neoplasm: Secondary | ICD-10-CM

## 2019-06-29 DIAGNOSIS — F419 Anxiety disorder, unspecified: Secondary | ICD-10-CM | POA: Diagnosis not present

## 2019-06-29 DIAGNOSIS — Z720 Tobacco use: Secondary | ICD-10-CM | POA: Diagnosis not present

## 2019-06-29 DIAGNOSIS — R0789 Other chest pain: Secondary | ICD-10-CM | POA: Insufficient documentation

## 2019-06-29 DIAGNOSIS — F17290 Nicotine dependence, other tobacco product, uncomplicated: Secondary | ICD-10-CM | POA: Diagnosis not present

## 2019-06-29 DIAGNOSIS — Z8616 Personal history of COVID-19: Secondary | ICD-10-CM | POA: Diagnosis not present

## 2019-06-29 DIAGNOSIS — J45909 Unspecified asthma, uncomplicated: Secondary | ICD-10-CM | POA: Insufficient documentation

## 2019-06-29 DIAGNOSIS — F1290 Cannabis use, unspecified, uncomplicated: Secondary | ICD-10-CM | POA: Diagnosis not present

## 2019-06-29 DIAGNOSIS — F4310 Post-traumatic stress disorder, unspecified: Secondary | ICD-10-CM | POA: Diagnosis not present

## 2019-06-29 NOTE — Patient Instructions (Signed)
Follow-up with primary physician as needed.   Steps to Quit Smoking Smoking tobacco is the leading cause of preventable death. It can affect almost every organ in the body. Smoking puts you and people around you at risk for many serious, long-lasting (chronic) diseases. Quitting smoking can be hard, but it is one of the best things that you can do for your health. It is never too late to quit. How do I get ready to quit? When you decide to quit smoking, make a plan to help you succeed. Before you quit:  Pick a date to quit. Set a date within the next 2 weeks to give you time to prepare.  Write down the reasons why you are quitting. Keep this list in places where you will see it often.  Tell your family, friends, and co-workers that you are quitting. Their support is important.  Talk with your doctor about the choices that may help you quit.  Find out if your health insurance will pay for these treatments.  Know the people, places, things, and activities that make you want to smoke (triggers). Avoid them. What first steps can I take to quit smoking?  Throw away all cigarettes at home, at work, and in your car.  Throw away the things that you use when you smoke, such as ashtrays and lighters.  Clean your car. Make sure to empty the ashtray.  Clean your home, including curtains and carpets. What can I do to help me quit smoking? Talk with your doctor about taking medicines and seeing a counselor at the same time. You are more likely to succeed when you do both.  If you are pregnant or breastfeeding, talk with your doctor about counseling or other ways to quit smoking. Do not take medicine to help you quit smoking unless your doctor tells you to do so. To quit smoking: Quit right away  Quit smoking totally, instead of slowly cutting back on how much you smoke over a period of time.  Go to counseling. You are more likely to quit if you go to counseling sessions regularly. Take  medicine You may take medicines to help you quit. Some medicines need a prescription, and some you can buy over-the-counter. Some medicines may contain a drug called nicotine to replace the nicotine in cigarettes. Medicines may:  Help you to stop having the desire to smoke (cravings).  Help to stop the problems that come when you stop smoking (withdrawal symptoms). Your doctor may ask you to use:  Nicotine patches, gum, or lozenges.  Nicotine inhalers or sprays.  Non-nicotine medicine that is taken by mouth. Find resources Find resources and other ways to help you quit smoking and remain smoke-free after you quit. These resources are most helpful when you use them often. They include:  Online chats with a Veterinary surgeon.  Phone quitlines.  Printed Materials engineer.  Support groups or group counseling.  Text messaging programs.  Mobile phone apps. Use apps on your mobile phone or tablet that can help you stick to your quit plan. There are many free apps for mobile phones and tablets as well as websites. Examples include Quit Guide from the [employer] and smokefree.gov  What things can I do to make it easier to quit?   Talk to your family and friends. Ask them to support and encourage you.  Call a phone quitline (1-800-QUIT-NOW), reach out to support groups, or work with a Veterinary surgeon.  Ask people who smoke to not smoke around you.  Avoid places that make you want to smoke, such as: ? Bars. ? Parties. ? Smoke-break areas at work.  Spend time with people who do not smoke.  Lower the stress in your life. Stress can make you want to smoke. Try these things to help your stress: ? Getting regular exercise. ? Doing deep-breathing exercises. ? Doing yoga. ? Meditating. ? Doing a body scan. To do this, close your eyes, focus on one area of your body at a time from head to toe. Notice which parts of your body are tense. Try to relax the muscles in those areas. How will I feel when I quit  smoking? Day 1 to 3 weeks Within the first 24 hours, you may start to have some problems that come from quitting tobacco. These problems are very bad 2-3 days after you quit, but they do not often last for more than 2-3 weeks. You may get these symptoms:  Mood swings.  Feeling restless, nervous, angry, or annoyed.  Trouble concentrating.  Dizziness.  Strong desire for high-sugar foods and nicotine.  Weight gain.  Trouble pooping (constipation).  Feeling like you may vomit (nausea).  Coughing or a sore throat.  Changes in how the medicines that you take for other issues work in your body.  Depression.  Trouble sleeping (insomnia). Week 3 and afterward After the first 2-3 weeks of quitting, you may start to notice more positive results, such as:  Better sense of smell and taste.  Less coughing and sore throat.  Slower heart rate.  Lower blood pressure.  Clearer skin.  Better breathing.  Fewer sick days. Quitting smoking can be hard. Do not give up if you fail the first time. Some people need to try a few times before they succeed. Do your best to stick to your quit plan, and talk with your doctor if you have any questions or concerns. Summary  Smoking tobacco is the leading cause of preventable death. Quitting smoking can be hard, but it is one of the best things that you can do for your health.  When you decide to quit smoking, make a plan to help you succeed.  Quit smoking right away, not slowly over a period of time.  When you start quitting, seek help from your doctor, family, or friends. This information is not intended to replace advice given to you by your health care provider. Make sure you discuss any questions you have with your health care provider. Document Revised: 10/17/2018 Document Reviewed: 04/12/2018 Elsevier Patient Education  [place].

## 2019-06-29 NOTE — Progress Notes (Signed)
Patient ID: Rachael Tanner, female    DOB: 29-Dec-2000  MRN: [id number]  CC: Hospital follow-up  Subjective: Rachael Tanner is a 19 y.o. female with history of asthma, anxiety, and STD who presents for hospital follow-up.  Last visit 06/10/2019 at the [facility] [place] by [place]. During that encounter patient presented with acute onset of intermittent left-sided chest pain. EKG was obtained. No acute process suspected and vital signs were stable for discharge. Naproxen was recommended for musculoskeletal type pain. Follow-up with PCP.   Today patient presents without chest pain, shortness of breath, cough. States she cannot recall when she last had chest pain but that it hasn't been recent. Reports typically when she has chest pain it radiates to the left lateral chest and abdominal wall. Reports that her chest pain is related primarily to anxiety, smoking, anxiety, and PTSD.   Reports she has anxiety related to the murder of her boyfriend in 2017. Reports she has not mentally recovered from the incident completely but things have gotten better with time. Reports she is now in a relationship and often has anxiety thinking that her current boyfriend may be murdered at any moment. States "I am clingy to him and don't want him to go anywhere without me because I am worried about him." Reports she has gotten better with this but thoughts of something awful happening to him still lingers. Reports some of the times in which she had chest pain she was also feeling anxious at the same time.  Reports she goes to counseling about once every 2 weeks. States her counselor told her she has separation anxiety and PTSD related to the murder of her boyfriend in 2017. Reports she feels as if counseling is helping. She was prescribed Hydroxyzine for anxiety. Reports she has only taken it twice. Reports the first time she took Hydroxyzine she felt fine. Reports the second time she  took the medication she cried all day long and had an anxiety attack. Reports she didn't like that side effect so she quit taking the medication. Reports she has not considered trying another medication because she doesn't want to take medication everyday. Denies suicidal ideation and thoughts of self-harm.   Reports she prefers not to take any medication for anxiety because she is concerned that she may become addicted to the medication. Reports she also has concerns for taking anxiety medication because she smokes marijuana and drinks alcohol and worried about the side effects of taking the medication while consuming those substances. Reports smoking marijuana 5/7 days per week. Reports smoking Black & Mild not quite as often. Reports drinking alcohol occasionally.   When asked if she is interested in smoking cessation medication states "I can quit smoking if I want to." Reports she can easily quit smoking marijuana but if she does quit she will smoke more Black & Mild as a replacement. Reports when she smokes marijuana she feels paranoid which sometimes onsets chest pain. Reports when she drinks alcohol prior to smoking marijuana she does not feel paranoid and she does not have chest pain.  Reports she had Covid in January 2021. Reports anxiety attacks and panic attacks were new onset after being diagnosed with Covid. Reports since being diagnosed with Covid she is worried about everything that happens to her body which is not normal to her. Stating "Sometimes I look stuff up online as well."  Reports when she was discharged from the hospital on in early May 2021  she was told to take Naproxen for pain management. Reports she did not take Naproxen because she had a cough and decided to take Dayquil for the cough which only lasted about 4 days after hospitalization. Reports she was told to not take Naproxen and Dayquil at the same time.   Current Outpatient Medications on File Prior to Visit  Medication  Sig Dispense Refill  . albuterol (VENTOLIN HFA) 108 (90 Base) MCG/ACT inhaler Inhale 2 puffs into the lungs every 4 (four) hours as needed for wheezing or shortness of breath. (Patient not taking: Reported on 04/06/2019) 3.7 g 0  . hydrOXYzine (ATARAX/VISTARIL) 25 MG tablet Take 1 tablet (25 mg total) by mouth every 6 (six) hours. (Patient not taking: Reported on 04/06/2019) 24 tablet 0  . mupirocin ointment (BACTROBAN) 2 % Apply 1 application topically 2 (two) times daily. (Patient not taking: Reported on 04/06/2019) 22 g 0  . naproxen (NAPROSYN) 500 MG tablet Take 1 tablet (500 mg total) by mouth 2 (two) times daily with a meal. 30 tablet 0   No current facility-administered medications on file prior to visit.    No Known Allergies  Social History   Socioeconomic History  . Marital status: Single    Spouse name: Not on file  . Number of children: Not on file  . Years of education: Not on file  . Highest education level: Not on file  Occupational History  . Not on file  Tobacco Use  . Smoking status: Never Smoker  . Smokeless tobacco: Never Used  Substance and Sexual Activity  . Alcohol use: No  . Drug use: Yes    Types: Marijuana  . Sexual activity: Not on file  Other Topics Concern  . Not on file  Social History Narrative  . Not on file   Social Determinants of Health   Financial Resource Strain:   . Difficulty of Paying Living Expenses:   Food Insecurity:   . Worried About Programme researcher, broadcasting/film/video in the Last Year:   . Barista in the Last Year:   Transportation Needs:   . Freight forwarder (Medical):   Marland Kitchen Lack of Transportation (Non-Medical):   Physical Activity:   . Days of Exercise per Week:   . Minutes of Exercise per Session:   Stress:   . Feeling of Stress :   Social Connections:   . Frequency of Communication with Friends and Family:   . Frequency of Social Gatherings with Friends and Family:   . Attends Religious Services:   . Active Member of Clubs or  Organizations:   . Attends Banker Meetings:   Marland Kitchen Marital Status:   Intimate Partner Violence:   . Fear of Current or Ex-Partner:   . Emotionally Abused:   Marland Kitchen Physically Abused:   . Sexually Abused:     No family history on file.  No past surgical history on file.  ROS: Review of Systems Negative except as stated above  PHYSICAL EXAM: Vitals with BMI 06/29/2019 06/10/2019 03/22/2019  Height - - -  Weight 129 lbs 6 oz - -  BMI - - -  Systolic 125 115 509  Diastolic 78 72 66  Pulse 81 88 83  SpO2- 95%, room air  Temperature- 97.17F, oral  Physical Exam General appearance - alert, well appearing, and in no distress and oriented to person, place, and time Mental status - alert, oriented to person, place, and time, anxious Neck - supple, no significant adenopathy  Lymphatics - no palpable lymphadenopathy, no hepatosplenomegaly Chest - clear to auscultation, no wheezes, rales or rhonchi, symmetric air entry, no tachypnea, retractions or cyanosis Heart - normal rate, regular rhythm, normal S1, S2, no murmurs, rubs, clicks or gallops Abdomen - soft, nontender, nondistended, no masses or organomegaly Neurological - alert, oriented, normal speech, no focal findings or movement disorder noted, neck supple without rigidity, cranial nerves II through XII intact, funduscopic exam normal, discs flat and sharp, DTR's normal and symmetric, motor and sensory grossly normal bilaterally, normal muscle tone, no tremors, strength 5/5, Romberg sign negative, normal gait and station  ASSESSMENT AND PLAN: 1. Hospital discharge follow-up: -Today patient presents for hospital follow-up. Last visit 06/10/2019 at the [facility]. During that encounter patient was seen for intermittent chest pain. Patient was examined and discharged home with instructions to take Naproxen for musculoskeletal pain and to follow-up with PCP.  -Today patient does not have chest pain, shortness  of breath, or cough and vital signs are normal.  -EKG last obtained 06/10/2019 which resulted sinus rhythm with sinus arrhythmia with occasional premature ventricular complexes otherwise normal EKG. -It seems patient's intermittent chest pain is from a combination of smoking marijuana, anxiety, and PTSD. Patient describes when chest pain occurs it is typically related to one of these reasons. -Encouraged patient to continue counseling sessions for anxiety. Patient not ready for anti-anxiety medication at this time. -Follow-up with primary physician as needed. -Patient was given clear instructions to go to Emergency Department or return to medical center if symptoms don't improve, worsen, or new problems develop.The patient verbalized understanding.  2. Declined smoking cessation: -Counseled to quit.  Discussed health risk associated with smoking and cessation methods.  Patient was given the opportunity to ask questions.  Patient verbalized understanding of the plan and was able to repeat key elements of the plan. Patient was given clear instructions to go to Emergency Department or return to medical center if symptoms don't improve, worsen, or new problems develop.The patient verbalized understanding.  Rema Fendt, NP

## 2019-07-01 DIAGNOSIS — Z3046 Encounter for surveillance of implantable subdermal contraceptive: Secondary | ICD-10-CM | POA: Diagnosis not present

## 2019-07-02 DIAGNOSIS — F930 Separation anxiety disorder of childhood: Secondary | ICD-10-CM | POA: Diagnosis not present

## 2019-07-09 DIAGNOSIS — F930 Separation anxiety disorder of childhood: Secondary | ICD-10-CM | POA: Diagnosis not present

## 2019-07-15 ENCOUNTER — Ambulatory Visit: Payer: Medicaid Other | Attending: Internal Medicine

## 2019-07-15 DIAGNOSIS — Z20822 Contact with and (suspected) exposure to covid-19: Secondary | ICD-10-CM | POA: Diagnosis not present

## 2019-07-16 DIAGNOSIS — F930 Separation anxiety disorder of childhood: Secondary | ICD-10-CM | POA: Diagnosis not present

## 2019-07-16 LAB — NOVEL CORONAVIRUS, NAA: SARS-CoV-2, NAA: NOT DETECTED

## 2019-07-16 LAB — SARS-COV-2, NAA 2 DAY TAT

## 2019-07-27 ENCOUNTER — Emergency Department (HOSPITAL_COMMUNITY)
Admission: EM | Admit: 2019-07-27 | Discharge: 2019-07-27 | Disposition: A | Discharge status: Home / Self Care | Payer: Medicaid Other | Attending: Emergency Medicine | Admitting: Emergency Medicine

## 2019-07-27 ENCOUNTER — Other Ambulatory Visit: Payer: Self-pay

## 2019-07-27 ENCOUNTER — Encounter (HOSPITAL_COMMUNITY): Payer: Self-pay

## 2019-07-27 ENCOUNTER — Emergency Department (HOSPITAL_COMMUNITY)
Admission: EM | Admit: 2019-07-27 | Discharge: 2019-07-27 | Disposition: A | Discharge status: Home / Self Care | Payer: Medicaid Other

## 2019-07-27 DIAGNOSIS — R0789 Other chest pain: Secondary | ICD-10-CM | POA: Insufficient documentation

## 2019-07-27 DIAGNOSIS — F419 Anxiety disorder, unspecified: Secondary | ICD-10-CM | POA: Insufficient documentation

## 2019-07-27 NOTE — ED Provider Notes (Signed)
[facility]-EMERGENCY DEPT Provider Note   CSN: [id number] Arrival date & time: 07/27/19  1444     History Chief Complaint  Patient presents with  . Headache  . Anxiety    Rachael Tanner is a 19 y.o. female.  HPI Patient is a 19 year old female who presents with multiple complaints.  Patient states about 2 months ago she experienced an episode of left-sided chest pain.  Since this occurred she has been experiencing intermittent, daily, left lateral chest wall tightness.  She states when it occurs she will sometimes "have her boyfriend massage it" which typically helps.  She has not taken anything for her symptoms.  When she was evaluated for this in the past she was prescribed naproxen but states she never took the medication.  Earlier today she additionally experienced an episode of mild shortness of breath as well as "weakness".  She states she has had 1-2 panic attacks since having COVID-19 in January of this year.  She states this occurrence feels similar to prior panic attacks.  She was prescribed Vistaril in the past for anxiety but does not take this medication for many months.  She states when her episode occurred today she was "thinking about her chest tightness and became worried".  She additionally reports a very mild frontal headache.  She reports a history of smoking cigars occasionally but stopped 1 month ago.  She denies fevers, chills, chest pain, shortness of breath, abdominal pain, nausea, vomiting, diarrhea, dysuria, syncope.  She is on Depo-Provera for birth control and states her last injection was in May.  No recent travel.  No trauma.  No recent surgeries.  She denies a history of blood clots.  No leg swelling or calf tenderness.     Past Medical History:  Diagnosis Date  . Anxiety   . STD (female)     There are no problems to display for this patient.   History reviewed. No pertinent surgical history.   OB History   No obstetric history  on file.     Family History  Problem Relation Age of Onset  . Hypertension Mother     Social History   Tobacco Use  . Smoking status: Never Smoker  . Smokeless tobacco: Never Used  Vaping Use  . Vaping Use: Never used  Substance Use Topics  . Alcohol use: Yes  . Drug use: Not Currently    Types: Marijuana    Home Medications Prior to Admission medications   Medication Sig Start Date End Date Taking? Authorizing Provider  albuterol (VENTOLIN HFA) 108 (90 Base) MCG/ACT inhaler Inhale 2 puffs into the lungs every 4 (four) hours as needed for wheezing or shortness of breath. Patient not taking: Reported on 04/06/2019 03/22/19   Charlestine Night, PA-C  hydrOXYzine (ATARAX/VISTARIL) 25 MG tablet Take 1 tablet (25 mg total) by mouth every 6 (six) hours. Patient not taking: Reported on 04/06/2019 03/19/19   Wieters, Fran Lowes C, PA-C  mupirocin ointment (BACTROBAN) 2 % Apply 1 application topically 2 (two) times daily. Patient not taking: Reported on 04/06/2019 12/25/17   Wieters, Hallie C, PA-C  naproxen (NAPROSYN) 500 MG tablet Take 1 tablet (500 mg total) by mouth 2 (two) times daily with a meal. Patient not taking: Reported on 06/29/2019 06/10/19   Wallis Bamberg, PA-C    Allergies    Patient has no known allergies.  Review of Systems   Review of Systems  All other systems reviewed and are negative. Ten systems reviewed and  are negative for acute change, except as noted in the HPI.    Physical Exam Updated Vital Signs BP 119/82 (BP Location: Left Arm)   Pulse 84   Temp 99.9 F (37.7 C) (Oral)   Resp 18   Ht 5\' 5"  (1.651 m)   Wt 57.6 kg   SpO2 99%   BMI 21.13 kg/m   Physical Exam Vitals and nursing note reviewed.  Constitutional:      General: She is not in acute distress.    Appearance: Normal appearance. She is well-developed and normal weight. She is not ill-appearing, toxic-appearing or diaphoretic.  HENT:     Head: Normocephalic and atraumatic.     Right Ear: External  ear normal.     Left Ear: External ear normal.     Nose: Nose normal.     Mouth/Throat:     Mouth: Mucous membranes are moist.     Pharynx: Oropharynx is clear. No oropharyngeal exudate or posterior oropharyngeal erythema.  Eyes:     General: No visual field deficit.    Extraocular Movements: Extraocular movements intact.  Cardiovascular:     Rate and Rhythm: Normal rate and regular rhythm.     Pulses: Normal pulses.     Heart sounds: Normal heart sounds. No murmur heard.  No friction rub. No gallop.      Comments: Very mild TTP noted along the left anterior chest wall.  No crepitus.  No flail chest. Pulmonary:     Effort: Pulmonary effort is normal. No respiratory distress.     Breath sounds: Normal breath sounds. No stridor. No wheezing, rhonchi or rales.     Comments: Symmetrical rise and fall the chest with inspiration and expiration. Abdominal:     General: Abdomen is flat.     Tenderness: There is no abdominal tenderness.  Musculoskeletal:        General: Normal range of motion.     Cervical back: Normal range of motion and neck supple. No tenderness.  Skin:    General: Skin is warm and dry.  Neurological:     General: No focal deficit present.     Mental Status: She is alert and oriented to person, place, and time.     GCS: GCS eye subscore is 4. GCS verbal subscore is 5. GCS motor subscore is 6.     Cranial Nerves: No cranial nerve deficit, dysarthria or facial asymmetry.     Comments: Patient is oriented to person, place, time.  She is speaking clearly, in complete sentences, coherently.  Negative pronator drift.  Finger-to-nose intact bilaterally with no visible signs of dysmetria.  Extraocular movements intact.  Distal sensation intact.  Strength is 5 out of 5 in all 4 extremities.  Psychiatric:        Mood and Affect: Mood normal.        Behavior: Behavior normal.     ED Results / Procedures / Treatments   Labs (all labs ordered are listed, but only abnormal  results are displayed) Labs Reviewed - No data to display  EKG EKG Interpretation  Date/Time:  Monday July 27 2019 15:11:05 EDT Ventricular Rate:  81 PR Interval:  144 QRS Duration: 68 QT Interval:  368 QTC Calculation: 427 R Axis:   178 Text Interpretation: Limb lead reversal Normal sinus rhythm with sinus arrhythmia Confirmed by Aletta Edouard [phone]) on 07/27/2019 5:56:10 PM   Radiology No results found.  Procedures Procedures (including critical care time)  Medications Ordered in ED Medications -  No data to display  ED Course  I have reviewed the triage vital signs and the nursing notes.  Pertinent labs & imaging results that were available during my care of the patient were reviewed by me and considered in my medical decision making (see chart for details).    MDM Rules/Calculators/A&P                          Patient is a 19 year old female with a history of anxiety as well as chest pain.  She presents today for both.  Her symptoms earlier today seem consistent with her anxiety.  Patient agrees.  Wells criteria for PE rule out is a 0.  PERC criteria negative.  Not tachycardic.  Not hypertensive.  Saturating at 99%.  Her neurological exam is benign.  I discussed this with the patient.  She has a follow-up appointment with her primary care provider in 2 to 3 days.  I highly recommended that she keep this appointment to discuss her symptoms with her PCP further.  I recommended that she discuss her symptoms with her PCP and consider referral to a psychologist or psychiatrist as well.  She has prescriptions at home for both naproxen and Vistaril, which she has not taken.  We discussed proper dosing for both and when to appropriately take them.  Her questions were answered and she was amicable at the time of discharge.  Her vital signs are stable.  Patient discharged to home/self care.  Condition at discharge: Stable  Note: Portions of this report may have been transcribed  using voice recognition software. Every effort was made to ensure accuracy; however, inadvertent computerized transcription errors may be present.    Final Clinical Impression(s) / ED Diagnoses Final diagnoses:  Anxiety  Chest wall discomfort   Rx / DC Orders ED Discharge Orders    None       Placido Sou, PA-C 07/27/19 1859    Terrilee Files, MD 07/28/19 1426

## 2019-07-27 NOTE — Discharge Instructions (Signed)
I recommend beginning to take naproxen for your chest wall discomfort.  I also recommend taking hydroxyzine as needed for management of your anxiety symptoms.  Please follow the instructions on the bottle.  Please follow-up with your primary care provider in the next 2 to 3 days at your regularly scheduled appointment.  Please return to the emergency department with any new or worsening symptoms.  It was a pleasure to meet.

## 2019-07-27 NOTE — ED Triage Notes (Signed)
Patient states she has been to [facility] and another doctor with c/o chest pain x 2-3 times. Patient states today, she had a "Panic attack." approx 1-2 hours ago and felt SOB. Patient is not having SOB at this time, but c/o headache.

## 2019-07-27 NOTE — ED Notes (Signed)
An After Visit Summary was printed and given to the patient. Discharge instructions given and no further questions at this time.  

## 2019-07-30 ENCOUNTER — Ambulatory Visit: Payer: Medicaid Other | Admitting: Internal Medicine

## 2019-07-30 DIAGNOSIS — F930 Separation anxiety disorder of childhood: Secondary | ICD-10-CM | POA: Diagnosis not present

## 2019-07-31 DIAGNOSIS — Z8616 Personal history of COVID-19: Secondary | ICD-10-CM | POA: Diagnosis not present

## 2019-07-31 DIAGNOSIS — Z20822 Contact with and (suspected) exposure to covid-19: Secondary | ICD-10-CM | POA: Diagnosis not present

## 2019-08-05 DIAGNOSIS — F930 Separation anxiety disorder of childhood: Secondary | ICD-10-CM | POA: Diagnosis not present

## 2019-08-08 ENCOUNTER — Emergency Department (HOSPITAL_COMMUNITY): Payer: Medicaid Other

## 2019-08-08 ENCOUNTER — Other Ambulatory Visit: Payer: Self-pay

## 2019-08-08 ENCOUNTER — Encounter (HOSPITAL_COMMUNITY): Payer: Self-pay

## 2019-08-08 ENCOUNTER — Emergency Department (HOSPITAL_COMMUNITY)
Admission: EM | Admit: 2019-08-08 | Discharge: 2019-08-08 | Disposition: A | Discharge status: Home / Self Care | Payer: Medicaid Other | Attending: Emergency Medicine | Admitting: Emergency Medicine

## 2019-08-08 DIAGNOSIS — R0789 Other chest pain: Secondary | ICD-10-CM | POA: Diagnosis not present

## 2019-08-08 MED ORDER — ACETAMINOPHEN 325 MG PO TABS
650.0000 mg | ORAL_TABLET | Freq: Once | ORAL | Status: AC
  Administered 2019-08-08: 650 mg via ORAL
  Filled 2019-08-08: qty 2

## 2019-08-08 NOTE — ED Triage Notes (Signed)
Patient complains of left sided chest aching that started 5/9. Has been seen for same and she is unsure if related to her anxiety. Denies any associated symptoms. Patient alert and oriented, NAD

## 2019-08-08 NOTE — Discharge Instructions (Signed)
Return for shortness of breath, leg swelling, fevers or new concerns. Take tylenol and motrin as needed for pain. Follow up with your primary doctor.

## 2019-08-08 NOTE — ED Provider Notes (Signed)
[facility] EMERGENCY DEPARTMENT Provider Note   CSN: [id number] Arrival date & time: 08/08/19  1637     History No chief complaint on file.   Rachael Tanner is a 19 y.o. female.  Patient with history of anxiety presents with left sided chest pain that started early May.  Intermittent.  Worsening recently with specific movements and pressing on certain areas.  Patient denies fevers chills or shortness of breath.  No pleuritic component.  Patient denies classic blood clot risk factors.  Patient has no exertional symptoms.  No syncope.  No leg swelling or leg pain.  No recent surgeries and has no known cancer.  Patient currently not on estrogen containing birth control, progesterone only.        Past Medical History:  Diagnosis Date  . Anxiety   . STD (female)     There are no problems to display for this patient.   History reviewed. No pertinent surgical history.   OB History   No obstetric history on file.     Family History  Problem Relation Age of Onset  . Hypertension Mother     Social History   Tobacco Use  . Smoking status: Never Smoker  . Smokeless tobacco: Never Used  Vaping Use  . Vaping Use: Never used  Substance Use Topics  . Alcohol use: Yes  . Drug use: Not Currently    Types: Marijuana    Home Medications Prior to Admission medications   Medication Sig Start Date End Date Taking? Authorizing Provider  albuterol (VENTOLIN HFA) 108 (90 Base) MCG/ACT inhaler Inhale 2 puffs into the lungs every 4 (four) hours as needed for wheezing or shortness of breath. Patient not taking: Reported on 04/06/2019 03/22/19   Charlestine Night, PA-C  hydrOXYzine (ATARAX/VISTARIL) 25 MG tablet Take 1 tablet (25 mg total) by mouth every 6 (six) hours. Patient not taking: Reported on 04/06/2019 03/19/19   Wieters, Fran Lowes C, PA-C  mupirocin ointment (BACTROBAN) 2 % Apply 1 application topically 2 (two) times daily. Patient not taking: Reported on  04/06/2019 12/25/17   Wieters, Hallie C, PA-C  naproxen (NAPROSYN) 500 MG tablet Take 1 tablet (500 mg total) by mouth 2 (two) times daily with a meal. Patient not taking: Reported on 06/29/2019 06/10/19   Wallis Bamberg, PA-C    Allergies    Patient has no known allergies.  Review of Systems   Review of Systems  Constitutional: Negative for chills and fever.  HENT: Negative for congestion.   Eyes: Negative for visual disturbance.  Respiratory: Negative for shortness of breath.   Cardiovascular: Positive for chest pain. Negative for leg swelling.  Gastrointestinal: Negative for abdominal pain and vomiting.  Genitourinary: Negative for dysuria and flank pain.  Musculoskeletal: Negative for back pain, neck pain and neck stiffness.  Skin: Negative for rash.  Neurological: Negative for light-headedness and headaches.    Physical Exam Updated Vital Signs BP 120/75   Pulse 84   Temp 99.2 F (37.3 C) (Oral)   Resp (!) 24   SpO2 99%   Physical Exam Vitals and nursing note reviewed.  Constitutional:      Appearance: She is well-developed.  HENT:     Head: Normocephalic and atraumatic.  Eyes:     General:        Right eye: No discharge.        Left eye: No discharge.     Conjunctiva/sclera: Conjunctivae normal.  Neck:     Trachea: No tracheal deviation.  Cardiovascular:     Rate and Rhythm: Normal rate and regular rhythm.     Pulses: Normal pulses.     Heart sounds: No murmur heard.   Pulmonary:     Effort: Pulmonary effort is normal.     Breath sounds: Normal breath sounds.  Abdominal:     General: There is no distension.     Palpations: Abdomen is soft.     Tenderness: There is no abdominal tenderness. There is no guarding.  Musculoskeletal:     Cervical back: Normal range of motion and neck supple.     Comments: Patient has exquisite tenderness to palpation left upper lateral rib no step-off, no fluctuance.  Skin:    General: Skin is warm.     Capillary Refill: Capillary  refill takes less than 2 seconds.     Findings: No rash.  Neurological:     Mental Status: She is alert and oriented to person, place, and time.  Psychiatric:        Mood and Affect: Mood normal.     ED Results / Procedures / Treatments   Labs (all labs ordered are listed, but only abnormal results are displayed) Labs Reviewed - No data to display  EKG EKG Interpretation  Date/Time:  Saturday August 08 2019 18:40:48 EDT Ventricular Rate:  86 PR Interval:    QRS Duration: 87 QT Interval:  376 QTC Calculation: 450 R Axis:   48 Text Interpretation: Sinus rhythm Confirmed by Blane Ohara [phone]) on 08/08/2019 6:55:32 PM   Radiology DG Chest Portable 1 View  Result Date: 08/08/2019 CLINICAL DATA:  Chest pain. EXAM: PORTABLE CHEST 1 VIEW COMPARISON:  March 22, 2019 FINDINGS: The heart size and mediastinal contours are within normal limits. Both lungs are clear. The visualized skeletal structures are unremarkable. IMPRESSION: No active disease. Electronically Signed   By: Aram Candela M.D.   On: 08/08/2019 19:21    Procedures Procedures (including critical care time)  Medications Ordered in ED Medications  acetaminophen (TYLENOL) tablet 650 mg (has no administration in time range)    ED Course  I have reviewed the triage vital signs and the nursing notes.  Pertinent labs & imaging results that were available during my care of the patient were reviewed by me and considered in my medical decision making (see chart for details).    MDM Rules/Calculators/A&P                          Patient presents with reproducible left chest pain, young and healthy patient who denies blood clot risk factors.  Patient has normal heart rate when I am in the room, normal work of breathing.  I feel the risk of radiation is too great for such a low pretest probability for blood clot.  Discussed close outpatient follow-up and reasons to return.  Very low risk Wells.  Tylenol ordered as needed  for pain.  Chest x-ray no pneumothorax no acute findings.  EKG no acute findings.  Patient stable to follow-up with a primary doctor. Final Clinical Impression(s) / ED Diagnoses Final diagnoses:  Chest wall pain    Rx / DC Orders ED Discharge Orders    None       Blane Ohara, MD 08/08/19 1939

## 2019-09-22 DIAGNOSIS — Z20822 Contact with and (suspected) exposure to covid-19: Secondary | ICD-10-CM | POA: Diagnosis not present

## 2019-09-24 ENCOUNTER — Other Ambulatory Visit: Payer: Self-pay

## 2019-09-24 ENCOUNTER — Emergency Department (HOSPITAL_COMMUNITY)
Admission: EM | Admit: 2019-09-24 | Discharge: 2019-09-25 | Disposition: A | Discharge status: Home / Self Care | Payer: Medicaid Other | Attending: Emergency Medicine | Admitting: Emergency Medicine

## 2019-09-24 ENCOUNTER — Emergency Department (HOSPITAL_COMMUNITY): Payer: Medicaid Other

## 2019-09-24 ENCOUNTER — Encounter (HOSPITAL_COMMUNITY): Payer: Self-pay

## 2019-09-24 DIAGNOSIS — R519 Headache, unspecified: Secondary | ICD-10-CM | POA: Insufficient documentation

## 2019-09-24 DIAGNOSIS — F419 Anxiety disorder, unspecified: Secondary | ICD-10-CM | POA: Diagnosis not present

## 2019-09-24 DIAGNOSIS — R079 Chest pain, unspecified: Secondary | ICD-10-CM | POA: Diagnosis not present

## 2019-09-24 DIAGNOSIS — Z20822 Contact with and (suspected) exposure to covid-19: Secondary | ICD-10-CM | POA: Diagnosis not present

## 2019-09-24 DIAGNOSIS — U071 COVID-19: Secondary | ICD-10-CM | POA: Diagnosis not present

## 2019-09-24 DIAGNOSIS — R0602 Shortness of breath: Secondary | ICD-10-CM | POA: Diagnosis not present

## 2019-09-24 LAB — SARS CORONAVIRUS 2 BY RT PCR (HOSPITAL ORDER, PERFORMED IN ~~LOC~~ HOSPITAL LAB): SARS Coronavirus 2: NEGATIVE

## 2019-09-24 MED ORDER — ALBUTEROL SULFATE HFA 108 (90 BASE) MCG/ACT IN AERS
2.0000 | INHALATION_SPRAY | RESPIRATORY_TRACT | Status: DC | PRN
  Administered 2019-09-25: 2 via RESPIRATORY_TRACT
  Filled 2019-09-24: qty 6.7

## 2019-09-24 MED ORDER — IBUPROFEN 200 MG PO TABS
200.0000 mg | ORAL_TABLET | Freq: Once | ORAL | Status: AC
  Administered 2019-09-25: 200 mg via ORAL
  Filled 2019-09-24: qty 1

## 2019-09-24 MED ORDER — AEROCHAMBER PLUS FLO-VU MEDIUM MISC
1.0000 | Freq: Once | Status: AC
  Administered 2019-09-25: 1
  Filled 2019-09-24: qty 1

## 2019-09-24 NOTE — ED Triage Notes (Signed)
Pt reports that her cousin tested positive for COVID on Monday. She states that she was negative on Tuesday, but still feels SOB. Pt had COVID in Jan 2021. A&Ox4.

## 2019-09-24 NOTE — Discharge Instructions (Addendum)
1. Medications: Alternate tylenol and ibuprofen for fever control, albuterol; continue usual home medications 2. Treatment: rest, drink plenty of fluids, isolate for the next 10 days 3. Follow Up: Please followup with your primary doctor if your symptoms are not improving after 10-14 days; Please return to the ER for high fevers, persistent vomiting, shortness of breath or other concerns.

## 2019-09-24 NOTE — ED Provider Notes (Signed)
[facility]-EMERGENCY DEPT Provider Note   CSN: [id number] Arrival date & time: 09/24/19  1904     History Chief Complaint  Patient presents with  . Covid Exposure    Rachael Tanner is a 19 y.o. female with a hx of anxiety presents to the Emergency Department complaining of COVID exposure last week.  Pt reports her cousin tested positive for COVID on Monday (4 days ago) and they live together.  Patient reports negative Covid test 2 days ago.  Patient reports she is not vaccinated but did have Covid in June 2021.  Patient ports 6 days of intermittent, mild throbbing headaches without vision changes, nausea or vomiting.  No numbness, tingling or weakness.  Patient also reports some intermittent shortness of breath however reports these episodes have been in conjunction with her anxiety.  She reports she is very nervous about having Covid again but her symptoms this time do not feel the same as last time.  Denies loss of taste or smell, dyspnea on exertion, leg swelling.  Patient has no history of DVT or lupus.  Does take Depo-Provera.  Is not a regular smoker.  Patient denies palpitations or tachycardia.  Patient denies cough, fevers, chills, hemoptysis.    The history is provided by the patient and medical records. No language interpreter was used.       Past Medical History:  Diagnosis Date  . Anxiety   . STD (female)     There are no problems to display for this patient.   History reviewed. No pertinent surgical history.   OB History   No obstetric history on file.     Family History  Problem Relation Age of Onset  . Hypertension Mother     Social History   Tobacco Use  . Smoking status: Never Smoker  . Smokeless tobacco: Never Used  Vaping Use  . Vaping Use: Never used  Substance Use Topics  . Alcohol use: Yes  . Drug use: Not Currently    Types: Marijuana    Home Medications Prior to Admission medications   Medication Sig Start Date End  Date Taking? Authorizing Provider  albuterol (VENTOLIN HFA) 108 (90 Base) MCG/ACT inhaler Inhale 2 puffs into the lungs every 4 (four) hours as needed for wheezing or shortness of breath. Patient not taking: Reported on 04/06/2019 03/22/19   Charlestine Night, PA-C  hydrOXYzine (ATARAX/VISTARIL) 25 MG tablet Take 1 tablet (25 mg total) by mouth every 6 (six) hours. Patient not taking: Reported on 04/06/2019 03/19/19   Wieters, Fran Lowes C, PA-C  mupirocin ointment (BACTROBAN) 2 % Apply 1 application topically 2 (two) times daily. Patient not taking: Reported on 04/06/2019 12/25/17   Wieters, Hallie C, PA-C  naproxen (NAPROSYN) 500 MG tablet Take 1 tablet (500 mg total) by mouth 2 (two) times daily with a meal. Patient not taking: Reported on 06/29/2019 06/10/19   Wallis Bamberg, PA-C    Allergies    Patient has no known allergies.  Review of Systems   Review of Systems  Constitutional: Negative for appetite change, diaphoresis, fatigue, fever and unexpected weight change.  HENT: Negative for mouth sores.   Eyes: Negative for visual disturbance.  Respiratory: Positive for shortness of breath. Negative for cough, chest tightness and wheezing.   Cardiovascular: Negative for chest pain.  Gastrointestinal: Negative for abdominal pain, constipation, diarrhea, nausea and vomiting.  Endocrine: Negative for polydipsia, polyphagia and polyuria.  Genitourinary: Negative for dysuria, frequency, hematuria and urgency.  Musculoskeletal: Negative for back  pain and neck stiffness.  Skin: Negative for rash.  Allergic/Immunologic: Negative for immunocompromised state.  Neurological: Positive for headaches. Negative for syncope and light-headedness.  Hematological: Does not bruise/bleed easily.  Psychiatric/Behavioral: Negative for sleep disturbance. The patient is not nervous/anxious.     Physical Exam Updated Vital Signs BP 130/89 (BP Location: Left Arm)   Pulse 94   Temp 98.8 F (37.1 C) (Oral)   Resp 19    SpO2 99%   Physical Exam Vitals and nursing note reviewed.  Constitutional:      General: She is not in acute distress.    Appearance: She is not diaphoretic.  HENT:     Head: Normocephalic.  Eyes:     General: No scleral icterus.    Conjunctiva/sclera: Conjunctivae normal.  Cardiovascular:     Rate and Rhythm: Normal rate and regular rhythm.     Pulses: Normal pulses.          Radial pulses are 2+ on the right side and 2+ on the left side.  Pulmonary:     Effort: No tachypnea, accessory muscle usage, prolonged expiration, respiratory distress or retractions.     Breath sounds: Normal breath sounds. No stridor.     Comments: Equal chest rise. No increased work of breathing. Clear and equal breath sounds throughout Abdominal:     General: There is no distension.     Palpations: Abdomen is soft.     Tenderness: There is no abdominal tenderness. There is no guarding or rebound.  Musculoskeletal:     Cervical back: Normal range of motion.     Comments: Moves all extremities equally and without difficulty.  Skin:    General: Skin is warm and dry.     Capillary Refill: Capillary refill takes less than 2 seconds.  Neurological:     Mental Status: She is alert.     GCS: GCS eye subscore is 4. GCS verbal subscore is 5. GCS motor subscore is 6.     Comments: Speech is clear and goal oriented.  Psychiatric:        Mood and Affect: Mood normal.     ED Results / Procedures / Treatments   Labs (all labs ordered are listed, but only abnormal results are displayed) Labs Reviewed  SARS CORONAVIRUS 2 BY RT PCR [facility] ORDER, PERFORMED IN [facility] LAB)    EKG EKG Interpretation  Date/Time:  Thursday September 24 2019 19:46:35 EDT Ventricular Rate:  87 PR Interval:    QRS Duration: 71 QT Interval:  338 QTC Calculation: 407 R Axis:   37 Text Interpretation: Sinus rhythm Low voltage, precordial leads Baseline wander in lead(s) II III aVF V4 V5 12 Lead; Mason-Likar No  significant change since last tracing Confirmed by Alvira Monday ([id number]) on 09/24/2019 9:26:37 PM   Radiology DG Chest 2 View  Result Date: 09/24/2019 CLINICAL DATA:  COVID-19 exposure, short of breath, chest pain EXAM: CHEST - 2 VIEW COMPARISON:  08/08/2019 FINDINGS: The heart size and mediastinal contours are within normal limits. Both lungs are clear. The visualized skeletal structures are unremarkable. IMPRESSION: No active cardiopulmonary disease. Electronically Signed   By: Sharlet Salina M.D.   On: 09/24/2019 20:16    Procedures Procedures (including critical care time)  Medications Ordered in ED Medications  albuterol (VENTOLIN HFA) 108 (90 Base) MCG/ACT inhaler 2 puff (has no administration in time range)  AeroChamber Plus Flo-Vu Medium MISC 1 each (has no administration in time range)  ibuprofen (ADVIL) tablet 200 mg (  has no administration in time range)    ED Course  I have reviewed the triage vital signs and the nursing notes.  Pertinent labs & imaging results that were available during my care of the patient were reviewed by me and considered in my medical decision making (see chart for details).  Clinical Course as of Sep 23 2348  Thu Sep 24, 2019  2346 Negative  SARS Coronavirus 2: NEGATIVE [HM]  2346 No consolidation or groundglass opacities.  I personally evaluated these images.  DG Chest 2 View [HM]  2346 EKG without tachycardia.  EKG 12-Lead [HM]    Clinical Course User Index [HM] Nashid Pellum, Boyd Kerbs   MDM Rules/Calculators/A&P                           ALZORA HA was evaluated in Emergency Department on 09/24/2019 for the symptoms described in the history of present illness. She was evaluated in the context of the global COVID-19 pandemic, which necessitated consideration that the patient might be at risk for infection with the SARS-CoV-2 virus that causes COVID-19. Institutional protocols and algorithms that pertain to the evaluation of  patients at risk for COVID-19 are in a state of rapid change based on information released by regulatory bodies including the CDC and federal and state organizations. These policies and algorithms were followed during the patient's care in the ED.  Presents with concerns of Covid.  She is well-appearing without tachycardia.  No peripheral edema or calf tenderness.  No dyspnea on exertion.  Clear and equal breath sounds.  EKG normal sinus rhythm.  Covid test negative and chest x-ray without consolidation or groundglass opacities.  Suspect that patient does not have Covid at this time given 2 - test this week and her well-appearing appearance.  Did recommend ibuprofen or Tylenol for headaches and will give albuterol inhaler as symptomatic therapy if patient does have Covid with negative test.  Discussed the need to isolate with the assumption that she has Covid despite her negative test and that she should return if she develops difficulty breathing, worsening shortness of breath, high fevers, inability to tolerate oral fluids or any other concerns.  Patient states understanding and is in agreement with the plan.   Final Clinical Impression(s) / ED Diagnoses Final diagnoses:  Close exposure to COVID-19 virus    Rx / DC Orders ED Discharge Orders    None       Keymarion Bearman, Boyd Kerbs 09/24/19 2350    Mesner, Barbara Cower, MD 09/25/19 [phone]

## 2019-09-25 NOTE — ED Notes (Signed)
Pt verbalized dc instructions and follow up care. Alert and ambulatory. No iv. 

## 2019-09-28 DIAGNOSIS — Z20822 Contact with and (suspected) exposure to covid-19: Secondary | ICD-10-CM | POA: Diagnosis not present

## 2019-09-29 ENCOUNTER — Ambulatory Visit: Payer: Self-pay | Admitting: Internal Medicine

## 2019-09-29 NOTE — Telephone Encounter (Signed)
Pt. Called to report chest pain in left side of left breast, left axilla and left rib cage. Rated pain at 6/10.  Stated this "has been going on for months."  Today reported that she feels some discomfort in right breast.  Stated the chest discomfort comes and goes.  Reported "it started out sharp, then became a lingering pain."  Denied dizziness, nausea, or sweating.  Admits to intermittent shortness of breath.  Reported hx of asthma when she was younger.  Denied fever or chills.  Reported feels "tense" in middle of chest.  Reported exposure one week ago to COVID; brother tested positive.  Has has 2 negative COVID test in past week. C/o scratcy throat, headache, and ear itching.  reported she also has allergies.  Pt. Reported she does have anxiety about the chest pain.  Stated "it is not getting better, and it is affecting my mind."  Scheduled for MyChart Video visit 8/25 @ 10:50; this appt. Was cleared by [facility], [place], in the office.  Care advice given per protocol. Advised to go to ER if symptoms worsen.  Verb. Understanding.      Reason for Disposition  [1] Chest pain lasts > 5 minutes AND [2] occurred in past 3 days (72 hours) (Exception: feels exactly the same as previously diagnosed heartburn and has accompanying sour taste in mouth)  Answer Assessment - Initial Assessment Questions 1. LOCATION: "Where does it hurt?"       Left side of breast, under arm and left rib cage, and right breast  2. RADIATION: "Does the pain go anywhere else?" (e.g., into neck, jaw, arms, back)     "I have felt pain in left wrist twice last week." 3. ONSET: "When did the chest pain begin?" (Minutes, hours or days)      "It started in May. It started out sharp and then became a lingering pain." 4. PATTERN "Does the pain come and go, or has it been constant since it started?"  "Does it get worse with exertion?"      "I don't feel it throughout the whole day" 5. DURATION: "How long does it last" (e.g., seconds, minutes,  hours)     It lasts awhile; "like the pain I have in my right breast has been there since 6:00 AM."  "I feel a tense feeling in middle chest." 6. SEVERITY: "How bad is the pain?"  (e.g., Scale 1-10; mild, moderate, or severe)    - MILD (1-3): doesn't interfere with normal activities     - MODERATE (4-7): interferes with normal activities or awakens from sleep    - SEVERE (8-10): excruciating pain, unable to do any normal activities       6/10  7. CARDIAC RISK FACTORS: "Do you have any history of heart problems or risk factors for heart disease?" (e.g., angina, prior heart attack; diabetes, high blood pressure, high cholesterol, smoker, or strong family history of heart disease)     Denied personal hx of heart disease, smokes "a black" intermittently; Mother has hx of HTN 8. PULMONARY RISK FACTORS: "Do you have any history of lung disease?"  (e.g., blood clots in lung, asthma, emphysema, birth control pills)     Had asthma several years ago; uses Depo   9. CAUSE: "What do you think is causing the chest pain?"     unknown 10. OTHER SYMPTOMS: "Do you have any other symptoms?" (e.g., dizziness, nausea, vomiting, sweating, fever, difficulty breathing, cough)       Denied dizziness, nausea,  sweating.  Reported shortness of breath at intervals;  Denied fever chills or cough.   11. PREGNANCY: "Is there any chance you are pregnant?" "When was your last menstrual period?"       Last period was 1-2 weeks ago; it was very light.  Protocols used: CHEST PAIN-A-AH

## 2019-09-30 ENCOUNTER — Other Ambulatory Visit: Payer: Self-pay

## 2019-09-30 ENCOUNTER — Encounter: Payer: Self-pay | Admitting: Physician Assistant

## 2019-09-30 ENCOUNTER — Ambulatory Visit: Payer: Medicaid Other | Attending: Physician Assistant | Admitting: Physician Assistant

## 2019-09-30 DIAGNOSIS — Z20822 Contact with and (suspected) exposure to covid-19: Secondary | ICD-10-CM

## 2019-09-30 DIAGNOSIS — R0602 Shortness of breath: Secondary | ICD-10-CM | POA: Diagnosis not present

## 2019-09-30 DIAGNOSIS — R0789 Other chest pain: Secondary | ICD-10-CM | POA: Diagnosis not present

## 2019-09-30 NOTE — Progress Notes (Signed)
Patient ID: Rachael Tanner, female   DOB: [birth year], 19 y.o.   MRN: [id number]   Virtual Visit via Telephone Note  I connected with Rachael Tanner on 09/30/19 at 10:50 AM EDT by telephone and verified that I am speaking with the correct person using two identifiers.   I discussed the limitations, risks, security and privacy concerns of performing an evaluation and management service by telephone and the availability of in person appointments. I also discussed with the patient that there may be a patient responsible charge related to this service. The patient expressed understanding and agreed to proceed.  PATIENT visit by telephone virtually in the context of Covid-19 pandemic. Patient location:  home My Location:  [facility] office Persons on the call:  Me and the patient   History of Present Illness: After ED visit 09/24/2019.  Her roommate had tested +for Covid the week before.   Roommate has been staying somewhere else.  The patient has had 3 negative tests.  Some HA.  Some SOB.  No fever.  No N/V/D.  Appetite is good.  No loss of smell or taste.  Feels hot in her chest and tingles in her back.  No urinary s/sx.    From ED A/P: Rachael Tanner was evaluated in Emergency Department on 09/24/2019 for the symptoms described in the history of present illness. She was evaluated in the context of the global COVID-19 pandemic, which necessitated consideration that the patient might be at risk for infection with the SARS-CoV-2 virus that causes COVID-19. Institutional protocols and algorithms that pertain to the evaluation of patients at risk for COVID-19 are in a state of rapid change based on information released by regulatory bodies including the CDC and federal and state organizations. These policies and algorithms were followed during the patient's care in the ED.  Presents with concerns of Covid.  She is well-appearing without tachycardia.  No peripheral edema or calf tenderness.  No dyspnea on  exertion.  Clear and equal breath sounds.  EKG normal sinus rhythm.  Covid test negative and chest x-ray without consolidation or groundglass opacities.  Suspect that patient does not have Covid at this time given 2 - test this week and her well-appearing appearance.  Did recommend ibuprofen or Tylenol for headaches and will give albuterol inhaler as symptomatic therapy if patient does have Covid with negative test.  Discussed the need to isolate with the assumption that she has Covid despite her negative test and that she should return if she develops difficulty breathing, worsening shortness of breath, high fevers, inability to tolerate oral fluids or any other concerns.  Patient states understanding and is in agreement with the plan.   Observations/Objective:  NAD.  A&Ox3   Assessment and Plan: 1. Shortness of breath resolved  2. Chest wall pain resolved  3. Exposure to COVID-19 virus 3 negative tests so far.    Follow Up Instructions: F/up with PCP as needed   I discussed the assessment and treatment plan with the patient. The patient was provided an opportunity to ask questions and all were answered. The patient agreed with the plan and demonstrated an understanding of the instructions.   The patient was advised to call back or seek an in-person evaluation if the symptoms worsen or if the condition fails to improve as anticipated.  I provided 15 minutes of non-face-to-face time during this encounter.   Georgian Co, PA-C

## 2019-10-07 DIAGNOSIS — Z3042 Encounter for surveillance of injectable contraceptive: Secondary | ICD-10-CM | POA: Diagnosis not present

## 2019-10-07 DIAGNOSIS — Z3009 Encounter for other general counseling and advice on contraception: Secondary | ICD-10-CM | POA: Diagnosis not present

## 2019-10-21 ENCOUNTER — Ambulatory Visit: Payer: Medicaid Other | Admitting: Physician Assistant

## 2019-10-22 ENCOUNTER — Ambulatory Visit: Payer: Self-pay | Admitting: *Deleted

## 2019-10-22 NOTE — Telephone Encounter (Signed)
C/o chest pain and heart palpitations since May. Reports having covid in Jan/Feb and since has had sharp pains in chest with palpitations last a few seconds. Pain comes and goes and feels warm and tingling in back. Denies difficulty breathing and , lightheadedness. C/o breasts feel pain and noted breasts have tissues that feel hard to touch. Reports she feels "weird " when she wakes up and may have SOB at times waking up . Virtual appt made for 10/23/19. Care advise given. Patient verbalized understanding of care advise and to call back or go to U or ED if symptoms worsen.   Reason for Disposition . Chest pain(s) lasting a few seconds  Answer Assessment - Initial Assessment Questions 1. LOCATION: "Where does it hurt?"       Chest and bilateral breasts 2. RADIATION: "Does the pain go anywhere else?" (e.g., into neck, jaw, arms, back)      chest gets warm and tingling to back  3. ONSET: "When did the chest pain begin?" (Minutes, hours or days)      Palpitations in May 4. PATTERN "Does the pain come and go, or has it been constant since it started?"  "Does it get worse with exertion?"      Comes and goes denies SOB of difficulty breathing  5. DURATION: "How long does it last" (e.g., seconds, minutes, hours)     Few seconds but catches me off guard  6. SEVERITY: "How bad is the pain?"  (e.g., Scale 1-10; mild, moderate, or severe)    - MILD (1-3): doesn't interfere with normal activities     - MODERATE (4-7): interferes with normal activities or awakens from sleep    - SEVERE (8-10): excruciating pain, unable to do any normal activities       Mild  7. CARDIAC RISK FACTORS: "Do you have any history of heart problems or risk factors for heart disease?" (e.g., angina, prior heart attack; diabetes, high blood pressure, high cholesterol, smoker, or strong family history of heart disease)     No   8. PULMONARY RISK FACTORS: "Do you have any history of lung disease?"  (e.g., blood clots in lung, asthma,  emphysema, birth control pills)     Uses inhalers , birth control. 9. CAUSE: "What do you think is causing the chest pain?"     Does not know that's why she is calling but knows it is not anxiety  10. OTHER SYMPTOMS: "Do you have any other symptoms?" (e.g., dizziness, nausea, vomiting, sweating, fever, difficulty breathing, cough)       No only breasts noted with hard feeling tissue  11. PREGNANCY: "Is there any chance you are pregnant?" "When was your last menstrual period?"       No does not have period due to birth control  Protocols used: CHEST PAIN-A-AH

## 2019-10-23 ENCOUNTER — Ambulatory Visit: Payer: Medicaid Other | Attending: Internal Medicine | Admitting: Internal Medicine

## 2019-10-23 ENCOUNTER — Other Ambulatory Visit: Payer: Self-pay

## 2019-10-23 DIAGNOSIS — F411 Generalized anxiety disorder: Secondary | ICD-10-CM | POA: Diagnosis not present

## 2019-10-23 DIAGNOSIS — N644 Mastodynia: Secondary | ICD-10-CM | POA: Diagnosis not present

## 2019-10-23 DIAGNOSIS — R0789 Other chest pain: Secondary | ICD-10-CM | POA: Diagnosis not present

## 2019-10-23 DIAGNOSIS — R002 Palpitations: Secondary | ICD-10-CM

## 2019-10-23 MED ORDER — BUSPIRONE HCL 5 MG PO TABS: 5.0000 mg | ORAL_TABLET | Freq: Two times a day (BID) | ORAL | 1 refills | Status: DC

## 2019-10-23 NOTE — Progress Notes (Signed)
Pt states she has been having the pain since she has had covid   Pt states the breast pain started a month ago

## 2019-10-23 NOTE — Progress Notes (Signed)
Virtual Visit via Telephone Note Due to current restrictions/limitations of in-office visits due to the COVID-19 pandemic, this scheduled clinical appointment was converted to a telehealth visit  I connected with Rachael Tanner on 10/23/19 at 4:40 p.m by telephone and verified that I am speaking with the correct person using two identifiers. I am in my office.  The patient is at home.  Only the patient and myself participated in this encounter.  I discussed the limitations, risks, security and privacy concerns of performing an evaluation and management service by telephone and the availability of in person appointments. I also discussed with the patient that there may be a patient responsible charge related to this service. The patient expressed understanding and agreed to proceed.   History of Present Illness: Pt with hx of COVID-19 02/2019, anxiety, tob use, marijuana use.  This is an urgent care visit scheduled for the patient engagement center yesterday.  Patient complains of intermittent chest pains with palpitations since having Covid in January of this year.  She thinks she needs to see a cardiologist. -In May of this year she was having sharp chest pains that lasted about 3 days.  They went away.  Then she started experiencing a tense feeling on the side of her left breast by the rib cage.  Her boyfriend and mother both contracted Covid in mid August.  She felt she had COVID a second time as well when they did but had tested negative.  She was waking up feeling short of breath, with chest pains heart feeling in her chest with tingling down her back.  Both breasts have also been tender to touch.  She is on Depo-Provera.  She did a home pregnancy test earlier this week that was negative.  She does not wear bra.  Seen in the emergency room in May, June and July for chest pains.  Each time pain was thought to be atypical.  Patient says they keep blaming it on her anxiety but that she never really  had anxiety until she had COVID.  She is currently not on medication for anxiety but would be open to trying medication.  However she does not feel that her chest pains and palpitations are due to anxiety.  She would like to see a cardiologist -Chest pains can occur at any time at rest or with ambulation and can last 20 to 45 minutes.  She described the sensation as aching and tense and can occur on the right side or the left side of the chest.  No radiation down the arms.  She smokes cigars.     No current outpatient medications on file prior to visit.   No current facility-administered medications on file prior to visit.    Observations/Objective: No direct observation done as this was a telephone encounter.  Assessment and Plan: 1. Atypical chest pain Chest pain sounds atypical.  However she would like to see the cardiologist so I will submit the referral. - Ambulatory referral to Cardiology  2. Palpitations TSH normal in May of this year. - Ambulatory referral to Cardiology  3. Breast pain Recommend that she wear good supportive bra.  Decrease consumption of caffeine if she drinks caffeinated sodas and other beverages containing caffeine.  Recommend trial of ibuprofen over-the-counter.  4. Generalized anxiety disorder We discussed trying her with BuSpar to help decrease anxiety symptoms.  Patient states she would like to try something but wanted to know whether she would have to take it every day or  as needed.  Advised her that it is best to take the BuSpar every day as prescribed but if she wants to take it as needed she can.  Prescription sent to her pharmacy.   Follow Up Instructions:    I discussed the assessment and treatment plan with the patient. The patient was provided an opportunity to ask questions and all were answered. The patient agreed with the plan and demonstrated an understanding of the instructions.   The patient was advised to call back or seek an in-person  evaluation if the symptoms worsen or if the condition fails to improve as anticipated.  I provided 21 minutes of non-face-to-face time during this encounter.   Jonah Blue, MD

## 2019-11-02 ENCOUNTER — Emergency Department (HOSPITAL_COMMUNITY)
Admission: EM | Admit: 2019-11-02 | Discharge: 2019-11-03 | Disposition: A | Discharge status: Home / Self Care | Payer: Medicaid Other | Attending: Emergency Medicine | Admitting: Emergency Medicine

## 2019-11-02 ENCOUNTER — Encounter (HOSPITAL_COMMUNITY): Payer: Self-pay | Admitting: Emergency Medicine

## 2019-11-02 ENCOUNTER — Other Ambulatory Visit: Payer: Self-pay

## 2019-11-02 ENCOUNTER — Emergency Department (HOSPITAL_COMMUNITY): Payer: Medicaid Other

## 2019-11-02 DIAGNOSIS — M549 Dorsalgia, unspecified: Secondary | ICD-10-CM | POA: Diagnosis not present

## 2019-11-02 DIAGNOSIS — Z5321 Procedure and treatment not carried out due to patient leaving prior to being seen by health care provider: Secondary | ICD-10-CM | POA: Diagnosis not present

## 2019-11-02 DIAGNOSIS — R079 Chest pain, unspecified: Secondary | ICD-10-CM | POA: Insufficient documentation

## 2019-11-02 NOTE — ED Triage Notes (Signed)
Patient is complaining of left chest pain about 45 min ago. Patient states the pain radiates to her back.

## 2019-11-02 NOTE — ED Notes (Signed)
Attempted lab x 2 but unsuccessful. RN, Elsie Ra made aware.

## 2019-11-03 LAB — URINALYSIS, ROUTINE W REFLEX MICROSCOPIC
Bilirubin Urine: NEGATIVE
Glucose, UA: NEGATIVE mg/dL
Hgb urine dipstick: NEGATIVE
Ketones, ur: NEGATIVE mg/dL
Leukocytes,Ua: NEGATIVE
Nitrite: NEGATIVE
Protein, ur: NEGATIVE mg/dL
Specific Gravity, Urine: 1.024 (ref 1.005–1.030)
pH: 6 (ref 5.0–8.0)

## 2019-11-03 LAB — CBC
HCT: 44.3 % (ref 36.0–46.0)
Hemoglobin: 14.7 g/dL (ref 12.0–15.0)
MCH: 27.9 pg (ref 26.0–34.0)
MCHC: 33.2 g/dL (ref 30.0–36.0)
MCV: 84.2 fL (ref 80.0–100.0)
Platelets: 231 10*3/uL (ref 150–400)
RBC: 5.26 MIL/uL — ABNORMAL HIGH (ref 3.87–5.11)
RDW: 12.4 % (ref 11.5–15.5)
WBC: 5.4 10*3/uL (ref 4.0–10.5)
nRBC: 0 % (ref 0.0–0.2)

## 2019-11-04 ENCOUNTER — Telehealth: Payer: Self-pay | Admitting: Internal Medicine

## 2019-11-04 NOTE — Telephone Encounter (Signed)
Pt went to the ED on 9/27 and got labs done pt is requesting results

## 2019-11-04 NOTE — Telephone Encounter (Signed)
Labs resulted include CBC and urinalysis which were norma.  If she has additional concerns, please schedule an appointment.

## 2019-11-04 NOTE — Telephone Encounter (Signed)
Copied from CRM [id number]. Topic: General - Inquiry >> Nov 03, 2019  3:44 PM Leary Roca wrote: Reason for CRM: Pt calling for regular lab results . Please advise

## 2019-11-04 NOTE — Telephone Encounter (Signed)
Called pt made aware of  MD note . Verbalized understanding. Would like to have a sooner appt. Referred to the mobile unit if needed to be seen sooner. Location and hours of operation provided

## 2019-11-13 ENCOUNTER — Other Ambulatory Visit: Payer: Self-pay | Admitting: Internal Medicine

## 2019-11-13 ENCOUNTER — Encounter: Payer: Self-pay | Admitting: Internal Medicine

## 2019-11-13 ENCOUNTER — Ambulatory Visit: Payer: Medicaid Other | Attending: Internal Medicine | Admitting: Internal Medicine

## 2019-11-13 ENCOUNTER — Other Ambulatory Visit (HOSPITAL_COMMUNITY)
Admission: RE | Admit: 2019-11-13 | Discharge: 2019-11-13 | Disposition: A | Discharge status: Home / Self Care | Payer: Medicaid Other | Source: Ambulatory Visit | Attending: Internal Medicine | Admitting: Internal Medicine

## 2019-11-13 ENCOUNTER — Other Ambulatory Visit: Payer: Self-pay

## 2019-11-13 VITALS — BP 111/75 | HR 88 | Resp 16 | Ht 66.5 in | Wt 147.4 lb

## 2019-11-13 DIAGNOSIS — Z1159 Encounter for screening for other viral diseases: Secondary | ICD-10-CM

## 2019-11-13 DIAGNOSIS — Z113 Encounter for screening for infections with a predominantly sexual mode of transmission: Secondary | ICD-10-CM | POA: Insufficient documentation

## 2019-11-13 DIAGNOSIS — Z2821 Immunization not carried out because of patient refusal: Secondary | ICD-10-CM

## 2019-11-13 DIAGNOSIS — Z124 Encounter for screening for malignant neoplasm of cervix: Secondary | ICD-10-CM | POA: Diagnosis not present

## 2019-11-13 DIAGNOSIS — N644 Mastodynia: Secondary | ICD-10-CM | POA: Insufficient documentation

## 2019-11-13 DIAGNOSIS — Z3009 Encounter for other general counseling and advice on contraception: Secondary | ICD-10-CM

## 2019-11-13 LAB — POCT URINE PREGNANCY: Preg Test, Ur: NEGATIVE

## 2019-11-13 NOTE — Progress Notes (Signed)
Patient ID: Rachael Tanner, female    DOB: 2000/06/28  MRN: [id number]  CC: Annual Exam and Gynecologic Exam   Subjective: Rachael Tanner is a 19 y.o. female who presents for well woman exam. Her concerns today include:  Pt with hx of COVID-19 02/2019, anxiety, tob use, marijuana use.  I had done an initial telephone visit with this patient back in August.  She was having some atypical chest pains and palpitations.  Referred to cardiology.  She has an appointment coming up this month.  Reports having pap few yrs ago that was positive for BV but no cancer cells.  No dischg at this time.  She is agreeable to STD screening including HIV and syphilis screen. Sexually active with same female partner x 4 yrs. On depo since end of last yr through HD.  Next shot due sometime next mth.  She has gained some weight since being on Depo.  She is to have some minor bleeding every month on the Depo Provera but no periods in 3 mths On telephone visit with me in August, she complained of tenderness in the breasts.  She is still being bothered with this problem.  Tender to touch. Use to have tenderness of nipples during cycles but this tenderness is in the breast tissue not including the nipples.  She has cut back on caffeinated beverages.  She still does not wear a bra.  She thinks the Depo may be causing the breast tenderness and plans not to get any further shots.  She is not interested in any other form of birth control at this time.  She uses condoms consistently. Cut back on sodas. Not wearing bra.  She has tried over-the-counter NSAIDs and has not found it helpful.   HM:  Decline flu.  Declines Tdap today.   Current Outpatient Medications on File Prior to Visit  Medication Sig Dispense Refill  . busPIRone (BUSPAR) 5 MG tablet Take 1 tablet (5 mg total) by mouth 2 (two) times daily. (Patient not taking: Reported on 11/13/2019) 60 tablet 1   No current facility-administered medications on file prior to  visit.    No Known Allergies  Social History   Socioeconomic History  . Marital status: Single    Spouse name: Not on file  . Number of children: Not on file  . Years of education: Not on file  . Highest education level: Not on file  Occupational History  . Not on file  Tobacco Use  . Smoking status: Never Smoker  . Smokeless tobacco: Never Used  Vaping Use  . Vaping Use: Never used  Substance and Sexual Activity  . Alcohol use: Yes  . Drug use: Not Currently    Types: Marijuana  . Sexual activity: Not on file  Other Topics Concern  . Not on file  Social History Narrative  . Not on file   Social Determinants of Health   Financial Resource Strain:   . Difficulty of Paying Living Expenses: Not on file  Food Insecurity:   . Worried About Programme researcher, broadcasting/film/video in the Last Year: Not on file  . Ran Out of Food in the Last Year: Not on file  Transportation Needs:   . Lack of Transportation (Medical): Not on file  . Lack of Transportation (Non-Medical): Not on file  Physical Activity:   . Days of Exercise per Week: Not on file  . Minutes of Exercise per Session: Not on file  Stress:   .  Feeling of Stress : Not on file  Social Connections:   . Frequency of Communication with Friends and Family: Not on file  . Frequency of Social Gatherings with Friends and Family: Not on file  . Attends Religious Services: Not on file  . Active Member of Clubs or Organizations: Not on file  . Attends Banker Meetings: Not on file  . Marital Status: Not on file  Intimate Partner Violence:   . Fear of Current or Ex-Partner: Not on file  . Emotionally Abused: Not on file  . Physically Abused: Not on file  . Sexually Abused: Not on file    Family History  Problem Relation Age of Onset  . Hypertension Mother     No past surgical history on file.  ROS: Review of Systems Negative except as stated above  PHYSICAL EXAM: BP 111/75   Pulse 88   Resp 16   Ht 5' 6.5"  (1.689 m)   Wt 147 lb 6.4 oz (66.9 kg)   SpO2 97%   BMI 23.43 kg/m   Wt Readings from Last 3 Encounters:  11/13/19 147 lb 6.4 oz (66.9 kg)  07/27/19 127 lb (57.6 kg)  06/29/19 129 lb 6.4 oz (58.7 kg)    Physical Exam  General appearance - alert, well appearing, young African-American female and in no distress.  Patient answers questions appropriately. Breasts -CMA Julius Bowels present for breast and pelvic exam.  No axillary lymphadenopathy.  Breasts tender to touch throughout.  No nipple discharge.  No abnormal masses felt. Pelvic - normal external genitalia, vulva, vagina, cervix.  Manual exam not done as patient was afraid it would be too painful  Results for orders placed or performed in visit on 11/13/19  POCT urine pregnancy  Result Value Ref Range   Preg Test, Ur Negative Negative    CMP Latest Ref Rng & Units 03/22/2019  Glucose 70 - 99 mg/dL 81  BUN 6 - 20 mg/dL 8  Creatinine 3.81 - 0.17 mg/dL 5.10  Sodium 258 - 527 mmol/L 139  Potassium 3.5 - 5.1 mmol/L 3.4(L)  Chloride 98 - 111 mmol/L 105  CO2 22 - 32 mmol/L 23  Calcium 8.9 - 10.3 mg/dL 8.9   Lipid Panel  No results found for: CHOL, TRIG, HDL, CHOLHDL, VLDL, LDLCALC, LDLDIRECT  CBC    Component Value Date/Time   WBC 5.4 11/02/2019 2355   RBC 5.26 (H) 11/02/2019 2355   HGB 14.7 11/02/2019 2355   HCT 44.3 11/02/2019 2355   PLT 231 11/02/2019 2355   MCV 84.2 11/02/2019 2355   MCH 27.9 11/02/2019 2355   MCHC 33.2 11/02/2019 2355   RDW 12.4 11/02/2019 2355   LYMPHSABS 1.5 03/22/2019 2018   MONOABS 0.3 03/22/2019 2018   EOSABS 0.1 03/22/2019 2018   BASOSABS 0.0 03/22/2019 2018    ASSESSMENT AND PLAN: 1. Pap smear for cervical cancer screening - Cytology - PAP  2. Encounter for counseling regarding contraception Discussed other methods of birth control with patient if she decides to stop the Depo.  She is not interested in any other method of birth control at this time but states she will no longer get the  Depo shots. - POCT urine pregnancy  3. Mastalgia in female Patient wonders whether the Depo was causing the breast soreness.  I did look it up on up-to-date and in 3% of the cases it can cause mastalgia.  She plans to stop the Depo.  We will check a urine pregnancy test.  If negative we will refer for mammogram also - POCT urine pregnancy  4. Screen for STD (sexually transmitted disease) - Cervicovaginal ancillary only - HIV Antibody (routine testing w rflx) - Hepatitis C Antibody - RPR  5. Influenza vaccination declined This was offered and patient declined  6. Tetanus, diphtheria, and acellular pertussis (Tdap) vaccination declined This was offered and patient declined  7. Need for hepatitis C screening test Screening to be done today.    Patient was given the opportunity to ask questions.  Patient verbalized understanding of the plan and was able to repeat key elements of the plan.   No orders of the defined types were placed in this encounter.    Requested Prescriptions    No prescriptions requested or ordered in this encounter    No follow-ups on file.  Jonah Blue, MD, FACP

## 2019-11-14 LAB — RPR: RPR Ser Ql: NONREACTIVE

## 2019-11-14 LAB — HEPATITIS C ANTIBODY: Hep C Virus Ab: <0.1 s/co ratio (ref 0.0–0.9)

## 2019-11-14 LAB — HIV ANTIBODY (ROUTINE TESTING W REFLEX): HIV Screen 4th Generation wRfx: NONREACTIVE

## 2019-11-15 LAB — MOLECULAR ANCILLARY ONLY
Bacterial Vaginitis (gardnerella): NEGATIVE
Candida Glabrata: POSITIVE — AB
Candida Vaginitis: NEGATIVE
Chlamydia: NEGATIVE
Comment: NEGATIVE
Comment: NEGATIVE
Comment: NEGATIVE
Comment: NEGATIVE
Comment: NEGATIVE
Comment: NORMAL
Neisseria Gonorrhea: NEGATIVE
Trichomonas: NEGATIVE

## 2019-11-16 ENCOUNTER — Other Ambulatory Visit: Payer: Self-pay | Admitting: Internal Medicine

## 2019-11-16 MED ORDER — FLUCONAZOLE 150 MG PO TABS: 150.0000 mg | ORAL_TABLET | Freq: Once | ORAL | 0 refills | Status: AC

## 2019-11-18 LAB — CYTOLOGY - PAP
Comment: NEGATIVE
Diagnosis: UNDETERMINED — AB
High risk HPV: NEGATIVE

## 2019-11-24 ENCOUNTER — Ambulatory Visit: Payer: Medicaid Other | Attending: Internal Medicine | Admitting: Internal Medicine

## 2019-11-24 ENCOUNTER — Encounter: Payer: Self-pay | Admitting: Internal Medicine

## 2019-11-24 ENCOUNTER — Other Ambulatory Visit: Payer: Self-pay

## 2019-11-24 VITALS — BP 123/82 | HR 77 | Resp 16 | Wt 149.6 lb

## 2019-11-24 DIAGNOSIS — N644 Mastodynia: Secondary | ICD-10-CM | POA: Insufficient documentation

## 2019-11-24 DIAGNOSIS — Z8616 Personal history of COVID-19: Secondary | ICD-10-CM | POA: Insufficient documentation

## 2019-11-24 DIAGNOSIS — R3589 Other polyuria: Secondary | ICD-10-CM | POA: Diagnosis not present

## 2019-11-24 DIAGNOSIS — Z79899 Other long term (current) drug therapy: Secondary | ICD-10-CM | POA: Diagnosis not present

## 2019-11-24 DIAGNOSIS — R109 Unspecified abdominal pain: Secondary | ICD-10-CM | POA: Diagnosis present

## 2019-11-24 DIAGNOSIS — F419 Anxiety disorder, unspecified: Secondary | ICD-10-CM | POA: Diagnosis not present

## 2019-11-24 DIAGNOSIS — R140 Abdominal distension (gaseous): Secondary | ICD-10-CM | POA: Diagnosis not present

## 2019-11-24 DIAGNOSIS — E119 Type 2 diabetes mellitus without complications: Secondary | ICD-10-CM | POA: Diagnosis not present

## 2019-11-24 LAB — POCT GLYCOSYLATED HEMOGLOBIN (HGB A1C): HbA1c POC (<> result, manual entry): 5.4 % (ref 4.0–5.6)

## 2019-11-24 LAB — POCT URINALYSIS DIP (CLINITEK)
Bilirubin, UA: NEGATIVE
Blood, UA: NEGATIVE
Glucose, UA: NEGATIVE mg/dL
Ketones, POC UA: NEGATIVE mg/dL
Leukocytes, UA: NEGATIVE
Nitrite, UA: NEGATIVE
POC PROTEIN,UA: NEGATIVE
Spec Grav, UA: 1.02 (ref 1.010–1.025)
Urobilinogen, UA: 0.2 E.U./dL
pH, UA: 6.5 (ref 5.0–8.0)

## 2019-11-24 NOTE — Progress Notes (Signed)
Patient ID: Rachael Tanner, female    DOB: Jun 23, 2000  MRN: [id number]  CC: Abdominal Pain   Subjective: Rachael Tanner is a 19 y.o. female who presents for abdominal pain Her concerns today include:  Pt with hx of anxiety, recurrent CP, mastalgia, COVID infection 02/2019  Pt c/o bloating in stomach intermittently Experienced quick sharp pain in abdomin x 2 yesterday. Lasted a few seconds. Slight Abdominal cramps. No vomiting or diarrhea.  Last BM was yesterday No relation to food.   Was concern when she saw she had vaginal yeast on studies done earlier this mth when we did her pap.  Took Diflucan.  No vaginal dischg or itching at this time. Urinating a lot.  No dysuria.   Denies increase thurst.  Maternal GF with DM Wanted to go over PAP results - ASCUS, neg HPV  Thinks ankles swollen. Still intermittent CP/palpitations.  Appt with cardiology tomorrow.    Current Outpatient Medications on File Prior to Visit  Medication Sig Dispense Refill  . busPIRone (BUSPAR) 5 MG tablet Take 1 tablet (5 mg total) by mouth 2 (two) times daily. (Patient not taking: Reported on 11/13/2019) 60 tablet 1   No current facility-administered medications on file prior to visit.    No Known Allergies  Social History   Socioeconomic History  . Marital status: Single    Spouse name: Not on file  . Number of children: Not on file  . Years of education: Not on file  . Highest education level: Not on file  Occupational History  . Not on file  Tobacco Use  . Smoking status: Never Smoker  . Smokeless tobacco: Never Used  Vaping Use  . Vaping Use: Never used  Substance and Sexual Activity  . Alcohol use: Yes  . Drug use: Not Currently    Types: Marijuana  . Sexual activity: Not on file  Other Topics Concern  . Not on file  Social History Narrative  . Not on file   Social Determinants of Health   Financial Resource Strain:   . Difficulty of Paying Living Expenses: Not on file  Food  Insecurity:   . Worried About Programme researcher, broadcasting/film/video in the Last Year: Not on file  . Ran Out of Food in the Last Year: Not on file  Transportation Needs:   . Lack of Transportation (Medical): Not on file  . Lack of Transportation (Non-Medical): Not on file  Physical Activity:   . Days of Exercise per Week: Not on file  . Minutes of Exercise per Session: Not on file  Stress:   . Feeling of Stress : Not on file  Social Connections:   . Frequency of Communication with Friends and Family: Not on file  . Frequency of Social Gatherings with Friends and Family: Not on file  . Attends Religious Services: Not on file  . Active Member of Clubs or Organizations: Not on file  . Attends Banker Meetings: Not on file  . Marital Status: Not on file  Intimate Partner Violence:   . Fear of Current or Ex-Partner: Not on file  . Emotionally Abused: Not on file  . Physically Abused: Not on file  . Sexually Abused: Not on file    Family History  Problem Relation Age of Onset  . Hypertension Mother     No past surgical history on file.  ROS: Review of Systems Negative except as stated above  PHYSICAL EXAM: BP 123/82   Pulse 77  Resp 16   Wt 149 lb 9.6 oz (67.9 kg)   SpO2 97%   BMI 23.78 kg/m   Physical Exam Constitutional:      Appearance: She is not ill-appearing.  Abdominal:     General: Bowel sounds are normal. There is no distension.     Palpations: Abdomen is soft. There is no hepatomegaly or splenomegaly.     Tenderness: There is no abdominal tenderness.  Neurological:     Mental Status: She is alert.   LE:  No edema noted in legs/ankles at this time.  CMP Latest Ref Rng & Units 03/22/2019  Glucose 70 - 99 mg/dL 81  BUN 6 - 20 mg/dL 8  Creatinine 1.61 - 0.96 mg/dL 0.45  Sodium 409 - 811 mmol/L 139  Potassium 3.5 - 5.1 mmol/L 3.4(L)  Chloride 98 - 111 mmol/L 105  CO2 22 - 32 mmol/L 23  Calcium 8.9 - 10.3 mg/dL 8.9   Lipid Panel  No results found for: CHOL,  TRIG, HDL, CHOLHDL, VLDL, LDLCALC, LDLDIRECT  CBC    Component Value Date/Time   WBC 5.4 11/02/2019 2355   RBC 5.26 (H) 11/02/2019 2355   HGB 14.7 11/02/2019 2355   HCT 44.3 11/02/2019 2355   PLT 231 11/02/2019 2355   MCV 84.2 11/02/2019 2355   MCH 27.9 11/02/2019 2355   MCHC 33.2 11/02/2019 2355   RDW 12.4 11/02/2019 2355   LYMPHSABS 1.5 03/22/2019 2018   MONOABS 0.3 03/22/2019 2018   EOSABS 0.1 03/22/2019 2018   BASOSABS 0.0 03/22/2019 2018   Results for orders placed or performed in visit on 11/24/19  POCT URINALYSIS DIP (CLINITEK)  Result Value Ref Range   Color, UA yellow yellow   Clarity, UA clear clear   Glucose, UA negative negative mg/dL   Bilirubin, UA negative negative   Ketones, POC UA negative negative mg/dL   Spec Grav, UA 9.147 8.295 - 1.025   Blood, UA negative negative   pH, UA 6.5 5.0 - 8.0   POC PROTEIN,UA negative negative, trace   Urobilinogen, UA 0.2 0.2 or 1.0 E.U./dL   Nitrite, UA Negative Negative   Leukocytes, UA Negative Negative  POCT glycosylated hemoglobin (Hb A1C)  Result Value Ref Range   Hemoglobin A1C     HbA1c POC (<> result, manual entry) 5.4 4.0 - 5.6 %   HbA1c, POC (prediabetic range)     HbA1c, POC (controlled diabetic range)       ASSESSMENT AND PLAN: 1. Abdominal bloating Exam at this time is benign. I recommend that she pay attention to when she gets the bloating in particular which she may have eaten just before the episode.  Told her that sometimes people can get bloating with dairy products or green leafy vegetables. -I went over the results of her Pap smear with her.  She had ASCUS with negative HPV.  Plan is for repeat Pap smear in 3 years.  2. Polyuria - POCT URINALYSIS DIP (CLINITEK) - POCT glycosylated hemoglobin (Hb A1C)   Patient was given the opportunity to ask questions.  Patient verbalized understanding of the plan and was able to repeat key elements of the plan.   Orders Placed This Encounter  Procedures    . POCT URINALYSIS DIP (CLINITEK)  . POCT glycosylated hemoglobin (Hb A1C)     Requested Prescriptions    No prescriptions requested or ordered in this encounter    No follow-ups on file.  Jonah Blue, MD, FACP

## 2019-11-25 ENCOUNTER — Encounter: Payer: Self-pay | Admitting: Internal Medicine

## 2019-11-25 ENCOUNTER — Ambulatory Visit (INDEPENDENT_AMBULATORY_CARE_PROVIDER_SITE_OTHER): Payer: Medicaid Other | Admitting: Internal Medicine

## 2019-11-25 ENCOUNTER — Other Ambulatory Visit: Payer: Self-pay | Admitting: Internal Medicine

## 2019-11-25 VITALS — BP 116/74 | HR 94 | Ht 66.0 in | Wt 149.2 lb

## 2019-11-25 DIAGNOSIS — R600 Localized edema: Secondary | ICD-10-CM | POA: Diagnosis not present

## 2019-11-25 DIAGNOSIS — R0789 Other chest pain: Secondary | ICD-10-CM | POA: Insufficient documentation

## 2019-11-25 DIAGNOSIS — R0602 Shortness of breath: Secondary | ICD-10-CM | POA: Diagnosis not present

## 2019-11-25 DIAGNOSIS — R079 Chest pain, unspecified: Secondary | ICD-10-CM | POA: Diagnosis not present

## 2019-11-25 DIAGNOSIS — R002 Palpitations: Secondary | ICD-10-CM | POA: Diagnosis not present

## 2019-11-25 DIAGNOSIS — N644 Mastodynia: Secondary | ICD-10-CM

## 2019-11-25 NOTE — Progress Notes (Signed)
Cardiology Office Note:    Date:  11/25/2019   ID:  Rachael Tanner, DOB 02-03-2001, MRN [id number]  PCP:  Marcine Matar, MD  [facility] Cardiologist:  No primary care provider on file.  [facility] Electrophysiologist:  None   Referring MD: Marcine Matar, MD   CC: Chest Pain Consulted for the evaluation of palpitations at the behest of Marcine Matar, MD  History of Present Illness:    Rachael Tanner is a 19 y.o. female with a hx of tobacco abuse, marijuana abuse, and anxiety who presents with ches tpain.  Patient notes that she had chest pain in her left breast with sharp chest pain.  Had return of this chest pain in October.  Chest and back pain; an aching pain.  This pain was constant from 9/27- till October 3rd.  Worse with coughing or laughing.  Not associated with exertion.  Chest pain has improved.  No change with rest.  No family history of MI early; grandmother had MI at age 25.  Patient notes palpitations.  Notes that when laying down she can feel racing heart.  Improves when she beats on her heart.  No change with exertion or rest.  No syncope or near syncope.    Notes that she had ankle swelling two weeks ago.  Now resolved.  Has had shortness of breath.  Woke up with shortness of breath.  No improvement with inhaler.  Notes weight gain.  Does do activity so does note exertional dyspnea.  Past Medical History:  Diagnosis Date  . Anxiety   . Anxiety   . COVID-19   . Marijuana use   . STD (female)   . Tobacco abuse    History reviewed. No pertinent surgical history.  Current Medications: Current Meds  Medication Sig  . busPIRone (BUSPAR) 5 MG tablet Take 1 tablet (5 mg total) by mouth 2 (two) times daily.  . medroxyPROGESTERone (DEPO-PROVERA) 150 MG/ML injection Inject 150 mg into the muscle every 3 (three) months.    Allergies:   Patient has no known allergies.   Social History   Socioeconomic History  . Marital status: Single    Spouse  name: Not on file  . Number of children: Not on file  . Years of education: Not on file  . Highest education level: Not on file  Occupational History  . Not on file  Tobacco Use  . Smoking status: Never Smoker  . Smokeless tobacco: Never Used  Vaping Use  . Vaping Use: Never used  Substance and Sexual Activity  . Alcohol use: Yes  . Drug use: Not Currently    Types: Marijuana  . Sexual activity: Not on file  Other Topics Concern  . Not on file  Social History Narrative  . Not on file   Social Determinants of Health   Financial Resource Strain:   . Difficulty of Paying Living Expenses: Not on file  Food Insecurity:   . Worried About Programme researcher, broadcasting/film/video in the Last Year: Not on file  . Ran Out of Food in the Last Year: Not on file  Transportation Needs:   . Lack of Transportation (Medical): Not on file  . Lack of Transportation (Non-Medical): Not on file  Physical Activity:   . Days of Exercise per Week: Not on file  . Minutes of Exercise per Session: Not on file  Stress:   . Feeling of Stress : Not on file  Social Connections:   .  Frequency of Communication with Friends and Family: Not on file  . Frequency of Social Gatherings with Friends and Family: Not on file  . Attends Religious Services: Not on file  . Active Member of Clubs or Organizations: Not on file  . Attends Banker Meetings: Not on file  . Marital Status: Not on file     Family History: The patient's family history includes Hypertension in her mother. Possible MI in grandmother at age 84 No arrhythmia history  ROS:   Please see the history of present illness.    All other systems reviewed and are negative.  EKGs/Labs/Other Studies Reviewed:    The following studies were reviewed today:  EKG:  11/02/19- SR 92   Recent Labs: 03/22/2019: BUN 8; Creatinine, Ser 0.78; Potassium 3.4; Sodium 139 06/10/2019: TSH 2.389 11/02/2019: Hemoglobin 14.7; Platelets 231   Physical Exam:    VS:  BP  116/74   Pulse 94   Ht 5\' 6"  (1.676 m)   Wt 149 lb 3.2 oz (67.7 kg)   SpO2 97%   BMI 24.08 kg/m     Wt Readings from Last 3 Encounters:  11/25/19 149 lb 3.2 oz (67.7 kg)  11/24/19 149 lb 9.6 oz (67.9 kg)  11/13/19 147 lb 6.4 oz (66.9 kg)     GEN:  Well nourished, well developed in no acute distress HEENT: Nose was bleeding at begging of exam NECK: No JVD; No carotid bruits LYMPHATICS: No lymphadenopathy CARDIAC: RRR, no murmurs, rubs, gallops RESPIRATORY:  Clear to auscultation without rales, wheezing or rhonchi  ABDOMEN: Soft, non-tender, non-distended MUSCULOSKELETAL:  No edema; No deformity  SKIN: Warm and dry NEUROLOGIC:  Alert and oriented x 3 PSYCHIATRIC:  Normal affect   ASSESSMENT:    1. Chest pain of uncertain etiology   2. Palpitations    PLAN:    In order of problems listed above:  Palpitations -  will order 14 day non-live ZioPatch  Chest Pain - atypical with hx of early family history - will get POEM (Exercise ECG)  SOB and Ankle  - will get echo  Tobacco Use - near quitting Marijuana Use- neat quitting 1. The patient was counseled on the dangers of tobacco use, both inhaled and oral, which include, but are not limited to cardiovascular disease, increased cancer risk of multiple types of cancer, COPD, peripheral vascular disease, strokes. 2. She was also counseled on the benefits of smoking cessation. 3. The patient was firmly advised to quit.    4. We also reviewed strategies to maximize success, including:  Removing cigarettes and smoking materials from environment  Stress management  Substitution of other forms of reinforcement Support of family/friends.  Selecting a quit date.  Patient provided contact information for 1-800-QUIT-NOW   3-4 months one time follow up then PRN unless new symptoms or abnormal test results warranting change in plan  Would be reasonable for  Virtual Follow up Would be reasonable for  APP Follow  up  Medication Adjustments/Labs and Tests Ordered: Current medicines are reviewed at length with the patient today.  Concerns regarding medicines are outlined above.  No orders of the defined types were placed in this encounter.  No orders of the defined types were placed in this encounter.   There are no Patient Instructions on file for this visit.   Signed, 01/13/20, MD  11/25/2019 10:01 AM    [facility]

## 2019-11-25 NOTE — Patient Instructions (Addendum)
Testing/Procedures: Your physician has requested that you have an exercise tolerance test. For further information please visit [email or web address] Please also follow instruction sheet, as given.  Your physician has recommended that you wear a 14 day ZIO cardiac monitor. ZIO monitors are medical devices that record the heart's electrical activity. Doctors most often use these monitors to diagnose arrhythmias. Arrhythmias are problems with the speed or rhythm of the heartbeat. The monitor is a small, portable device. You can wear one while you do your normal daily activities. This is usually used to diagnose what is causing palpitations/syncope (passing out).  Your physician has requested that you have an echocardiogram. Echocardiography is a painless test that uses sound waves to create images of your heart. It provides your doctor with information about the size and shape of your heart and how well your heart's chambers and valves are working. This procedure takes approximately one hour. There are no restrictions for this procedure.  Follow-Up: At [facility], you and your health needs are our priority.  As part of our continuing mission to provide you with exceptional heart care, we have created designated Provider Care Teams.  These Care Teams include your primary Cardiologist (physician) and Advanced Practice Providers (APPs -  Physician Assistants and Nurse Practitioners) who all work together to provide you with the care you need, when you need it.  We recommend signing up for the patient portal called "MyChart".  Sign up information is provided on this After Visit Summary.  MyChart is used to connect with patients for Virtual Visits (Telemedicine).  Patients are able to view lab/test results, encounter notes, upcoming appointments, etc.  Non-urgent messages can be sent to your provider as well.   To learn more about what you can do with MyChart, go to [email or web address].    Your next  appointment:   Your physician recommends that you schedule a follow-up appointment in: 3-4 MONTHS with Dr. Izora Ribas  The format for your next appointment:   In Person with Riley Lam, MD   Christena Deem- Long Term Monitor Instructions  Your physician has requested you wear your ZIO patch monitor_14_days.  This is a single patch monitor.  [employer] supplies one patch monitor per enrollment.  Additional stickers are not available.  Please do not apply patch if you will be having a Nuclear Stress Test, Echocardiogram, Cardiac CT, MRI, or Chest Xray during the time frame you would be wearing the monitor. The patch cannot be worn during these tests.  You cannot remove and re-apply the ZIO XT patch monitor.   Your ZIO patch monitor will be sent USPS Priority mail from [facility] directly to your home address. The monitor may also be mailed to a PO BOX if home delivery is not available.   It may take 3-5 days to receive your monitor after you have been enrolled.   Once you have received you monitor, please review enclosed instructions.  Your monitor has already been registered assigning a specific monitor serial # to you.   Applying the monitor  1. Shave hair from upper left chest.  2. Hold abrader disc by orange tab.  Rub abrader in 40 strokes over left upper chest as indicated in your monitor instructions. 3. Clean area with 4 enclosed alcohol pads .  Use all pads to assure are is cleaned thoroughly.  Let dry. 4. Apply patch as indicated in monitor instructions.  Patch will be place under collarbone on left side of chest with arrow pointing upward. 5. Rub  patch adhesive wings for 2 minutes.Remove white label marked "1".  Remove white label marked "2".  Rub patch adhesive wings for 2 additional minutes. 6. While looking in a mirror, press and release button in center of patch.  A small green light will flash 3-4 times .  This will be your only indicator the monitor has been turned  on. 7. Do not shower for the first 24 hours.  You may shower after the first 24 hours. 8. Press button if you feel a symptom. You will hear a small click.  Record Date, Time and Symptom in the Patient Log Book. 9. When you are ready to remove patch, follow instructions on last 2 pages of Patient Log Book.  Stick patch monitor onto last page of Patient Log Book. 10. Place Patient Log Book in [facility] box.  Use locking tab on box and tape box closed securely.  The [employer] and [employer] has [employer] on it.  Please place in mailbox as soon as possible.  Your physician should have your test results approximately 7 days after the monitor has been mailed back to [facility].   -- Call [facility] at [phone] if you have questions regarding your ZIO XT patch monitor.  Call them immediately if you see an orange light blinking on your monitor.  -- If your monitor falls off in less than 4 days contact our Monitor department at [phone].  If your monitor becomes loose or falls off after 4 days call [employer] at [phone] for suggestions on securing your monitor

## 2019-11-25 NOTE — Addendum Note (Signed)
Addended by: Dareen Piano on: 11/25/2019 11:32 AM   Modules accepted: Orders

## 2019-12-01 ENCOUNTER — Telehealth: Payer: Self-pay | Admitting: Internal Medicine

## 2019-12-01 NOTE — Telephone Encounter (Signed)
° ° ° °  Pt said she received her heart monitor, she was told to wear it in 2 weeks but she will have breast ultrasound on 11/02. She wanted to know if she needs to wait after her ultrasound and her echo ti wear heart monitor

## 2019-12-01 NOTE — Telephone Encounter (Addendum)
Returned call to patient. She should wait to apply monitor until have her ultrasound. Patient also requested to come into the office to have monitor applied. Appt made for 11-2 @430 

## 2019-12-01 NOTE — Telephone Encounter (Signed)
Will route this message to our Monitor Techs to further follow-up with the pt about heart monitor questions.

## 2019-12-08 ENCOUNTER — Other Ambulatory Visit (INDEPENDENT_AMBULATORY_CARE_PROVIDER_SITE_OTHER): Payer: Medicaid Other

## 2019-12-08 ENCOUNTER — Other Ambulatory Visit: Payer: Self-pay

## 2019-12-08 ENCOUNTER — Ambulatory Visit
Admission: RE | Admit: 2019-12-08 | Discharge: 2019-12-08 | Disposition: A | Discharge status: Home / Self Care | Payer: Medicaid Other | Source: Ambulatory Visit | Attending: Internal Medicine | Admitting: Internal Medicine

## 2019-12-08 DIAGNOSIS — N644 Mastodynia: Secondary | ICD-10-CM | POA: Diagnosis not present

## 2019-12-08 DIAGNOSIS — R002 Palpitations: Secondary | ICD-10-CM

## 2019-12-08 DIAGNOSIS — R079 Chest pain, unspecified: Secondary | ICD-10-CM

## 2019-12-18 ENCOUNTER — Other Ambulatory Visit (HOSPITAL_COMMUNITY)
Admission: RE | Admit: 2019-12-18 | Discharge: 2019-12-18 | Disposition: A | Discharge status: Home / Self Care | Payer: Medicaid Other | Source: Ambulatory Visit | Attending: Internal Medicine | Admitting: Internal Medicine

## 2019-12-18 ENCOUNTER — Telehealth: Payer: Self-pay | Admitting: Internal Medicine

## 2019-12-18 DIAGNOSIS — Z20822 Contact with and (suspected) exposure to covid-19: Secondary | ICD-10-CM | POA: Insufficient documentation

## 2019-12-18 DIAGNOSIS — Z01812 Encounter for preprocedural laboratory examination: Secondary | ICD-10-CM | POA: Diagnosis not present

## 2019-12-18 LAB — SARS CORONAVIRUS 2 (TAT 6-24 HRS): SARS Coronavirus 2: NEGATIVE

## 2019-12-18 NOTE — Telephone Encounter (Signed)
Patient calling in to ask if she can still go today for a covid screening. She missed her 105 appointment. Patient would like to know what her options are to get all the appointments done in the time they are scheduled with missing this appointment.   Please call/advise  Thank you!

## 2019-12-22 ENCOUNTER — Ambulatory Visit (INDEPENDENT_AMBULATORY_CARE_PROVIDER_SITE_OTHER): Payer: Medicaid Other

## 2019-12-22 ENCOUNTER — Other Ambulatory Visit: Payer: Self-pay

## 2019-12-22 ENCOUNTER — Ambulatory Visit (HOSPITAL_COMMUNITY): Payer: Medicaid Other | Attending: Cardiovascular Disease

## 2019-12-22 DIAGNOSIS — R002 Palpitations: Secondary | ICD-10-CM | POA: Diagnosis not present

## 2019-12-22 DIAGNOSIS — R079 Chest pain, unspecified: Secondary | ICD-10-CM | POA: Diagnosis not present

## 2019-12-22 DIAGNOSIS — R0602 Shortness of breath: Secondary | ICD-10-CM | POA: Insufficient documentation

## 2019-12-22 DIAGNOSIS — R600 Localized edema: Secondary | ICD-10-CM | POA: Diagnosis not present

## 2019-12-22 LAB — EXERCISE TOLERANCE TEST
Estimated workload: 10.1 METS
Exercise duration (min): 9 min
Exercise duration (sec): 0 s
MPHR: 199 {beats}/min
Peak HR: 193 {beats}/min
Percent HR: 96 %
RPE: 18
Rest HR: 78 {beats}/min

## 2019-12-22 LAB — ECHOCARDIOGRAM COMPLETE
Area-P 1/2: 2.68 cm2
S' Lateral: 2.5 cm

## 2020-02-11 ENCOUNTER — Other Ambulatory Visit: Payer: Self-pay

## 2020-02-11 ENCOUNTER — Ambulatory Visit (HOSPITAL_COMMUNITY)
Admission: EM | Admit: 2020-02-11 | Discharge: 2020-02-11 | Disposition: A | Discharge status: Home / Self Care | Payer: Medicaid Other

## 2020-02-11 DIAGNOSIS — Z20822 Contact with and (suspected) exposure to covid-19: Secondary | ICD-10-CM | POA: Diagnosis not present

## 2020-02-11 DIAGNOSIS — Z9189 Other specified personal risk factors, not elsewhere classified: Secondary | ICD-10-CM | POA: Diagnosis not present

## 2020-02-11 NOTE — ED Notes (Signed)
No answer in lobby or calling phone

## 2020-02-18 ENCOUNTER — Encounter: Payer: Self-pay | Admitting: Internal Medicine

## 2020-02-18 ENCOUNTER — Other Ambulatory Visit: Payer: Self-pay

## 2020-02-18 ENCOUNTER — Ambulatory Visit (INDEPENDENT_AMBULATORY_CARE_PROVIDER_SITE_OTHER): Payer: Medicaid Other | Admitting: Internal Medicine

## 2020-02-18 VITALS — BP 110/70 | HR 89 | Ht 65.0 in | Wt 157.8 lb

## 2020-02-18 DIAGNOSIS — I493 Ventricular premature depolarization: Secondary | ICD-10-CM

## 2020-02-18 DIAGNOSIS — R002 Palpitations: Secondary | ICD-10-CM

## 2020-02-18 MED ORDER — PROPRANOLOL HCL ER 60 MG PO CP24: 60.0000 mg | ORAL_CAPSULE | Freq: Every day | ORAL | 5 refills | Status: DC

## 2020-02-18 NOTE — Patient Instructions (Signed)
Medication Instructions:  Your physician has recommended you make the following change in your medication:  -- START Propranolol ER 60 mg - Take 1 tablet (60 mg) by mouth daily *If you need a refill on your cardiac medications before your next appointment, please call your pharmacy*  Follow-Up: At [facility], you and your health needs are our priority.  As part of our continuing mission to provide you with exceptional heart care, we have created designated Provider Care Teams.  These Care Teams include your primary Cardiologist (physician) and Advanced Practice Providers (APPs -  Physician Assistants and Nurse Practitioners) who all work together to provide you with the care you need, when you need it.  We recommend signing up for the patient portal called "MyChart".  Sign up information is provided on this After Visit Summary.  MyChart is used to connect with patients for Virtual Visits (Telemedicine).  Patients are able to view lab/test results, encounter notes, upcoming appointments, etc.  Non-urgent messages can be sent to your provider as well.   To learn more about what you can do with MyChart, go to [email or web address].    Your next appointment:   Your physician recommends that you schedule a follow-up appointment in: 3 MONTHS with Dr. Izora Ribas.   The format for your next appointment:   In Person with Riley Lam, MD

## 2020-02-18 NOTE — Progress Notes (Signed)
Cardiology Office Note:    Date:  02/18/2020   ID:  Donato Heinz, DOB February 10, 2000, MRN [id number]  PCP:  Marcine Matar, MD  [facility] Cardiologist:  No primary care provider on file.  [facility] Electrophysiologist:  None   Referring MD: Marcine Matar, MD   CC: follow up PVCs  History of Present Illness:    Rachael Tanner is a 20 y.o. female with a hx of tobacco abuse, marijuana abuse, and anxiety who presented 11/25/19.  In interval had normal echo and stress test; with a heart monitor that showed occasional PVCs.  Patient notes that she is doing ok.  Since last visit notes occasional funny heart beats with a warm feel in her chest.  Relevant interval testing or therapy include normal echo, normal stress, and occasional PVCs.  There are no interval hospital/ED visit- though there is an ED visit started in [facility].    No chest pain or pressure  No SOB/DOE and no PND/Orthopnea.  No weight gain or leg swelling.  Still notes flutter and palpitations  Feels them on different spots in her chest.  Past Medical History:  Diagnosis Date  . Anxiety   . Anxiety   . COVID-19   . Marijuana use   . STD (female)   . Tobacco abuse    History reviewed. No pertinent surgical history.  Current Medications: Current Meds  Medication Sig  . propranolol ER (INDERAL LA) 60 MG 24 hr capsule Take 1 capsule (60 mg total) by mouth daily.    Allergies:   Patient has no known allergies.   Social History   Socioeconomic History  . Marital status: Single    Spouse name: Not on file  . Number of children: Not on file  . Years of education: Not on file  . Highest education level: Not on file  Occupational History  . Not on file  Tobacco Use  . Smoking status: Never Smoker  . Smokeless tobacco: Never Used  Vaping Use  . Vaping Use: Never used  Substance and Sexual Activity  . Alcohol use: Yes  . Drug use: Not Currently    Types: Marijuana  . Sexual activity: Not on file   Other Topics Concern  . Not on file  Social History Narrative  . Not on file   Social Determinants of Health   Financial Resource Strain: Not on file  Food Insecurity: Not on file  Transportation Needs: Not on file  Physical Activity: Not on file  Stress: Not on file  Social Connections: Not on file     Family History: The patient's family history includes Hypertension in her mother. Possible MI in grandmother at age 77 No arrhythmia history  ROS:   Please see the history of present illness.    All other systems reviewed and are negative.  EKGs/Labs/Other Studies Reviewed:    The following studies were reviewed today:  EKG:   11/02/19- SR 92 WNL  Cardiac Event Monitoring: Date: 01/05/2020 Results:  Patient had a minimum heart rate of 60 bpm, maximum heart rate of 193 bpm, and average heart rate of 101 bpm.  Predominant underlying rhythm was sinus rhythm.  Isolated PACs were rare (<1.0%).  Isolated PVCs were occasional (2.3%), with rare couplets, bigeminy, and trigeminy present.  No evidence of complete heart block.  Triggered and diary events associated with sinus rhythm and isolated PVCs.   No malignant arrhythmias. Occasional PVCs, occasionally symptomatic.   Transthoracic Echocardiogram: Date: 12/22/2019 Results:  1. Left ventricular ejection fraction, by estimation, is 60 to 65%. The  left ventricle has normal function. The left ventricle has no regional  wall motion abnormalities. Left ventricular diastolic parameters were  normal.  2. Right ventricular systolic function is normal. The right ventricular  size is normal. There is normal pulmonary artery systolic pressure. The  estimated right ventricular systolic pressure is 18.9 mmHg.  3. The mitral valve is normal in structure. No evidence of mitral valve  regurgitation. No evidence of mitral stenosis.  4. The aortic valve is normal in structure. Aortic valve regurgitation is  not visualized.  No aortic stenosis is present.  5. The inferior vena cava is normal in size with greater than 50%  respiratory variability, suggesting right atrial pressure of 3 mmHg.   ECG Stress Testing: Date: 12/22/2019 Results:  Blood pressure demonstrated a normal response to exercise.  There was no ST segment deviation noted during stress.   ETT with normal exercise tolerance (9:00); no chest pain; normal blood pressure response; no ST changes; negative adequate exercise tolerance test; Duke treadmill score 9.  Recent Labs: 03/22/2019: BUN 8; Creatinine, Ser 0.78; Potassium 3.4; Sodium 139 06/10/2019: TSH 2.389 11/02/2019: Hemoglobin 14.7; Platelets 231   Physical Exam:    VS:  BP 110/70   Pulse 89   Ht 5\' 5"  (1.651 m)   Wt 157 lb 12.8 oz (71.6 kg)   SpO2 97%   BMI 26.26 kg/m     Wt Readings from Last 3 Encounters:  02/18/20 157 lb 12.8 oz (71.6 kg)  11/25/19 149 lb 3.2 oz (67.7 kg)  11/24/19 149 lb 9.6 oz (67.9 kg)     GEN:  Well nourished, well developed in no acute distress HEENT: Nose was bleeding at begging of exam NECK: No JVD; No carotid bruits LYMPHATICS: No lymphadenopathy CARDIAC: RRR, no murmurs, rubs, gallops RESPIRATORY:  Clear to auscultation without rales, wheezing or rhonchi  ABDOMEN: Soft, non-tender, non-distended MUSCULOSKELETAL:  No edema; No deformity  SKIN: Warm and dry NEUROLOGIC:  Alert and oriented x 3 PSYCHIATRIC:  Normal affect   ASSESSMENT:    1. Palpitations   2. PVC (premature ventricular contraction)    PLAN:    In order of problems listed above:  Occasional PVCs - Possible symptomatic - AV Nodal Therapy: will trial propranolol 60 mg PO extended release  Tobacco Abuse Marijuana Abuse has decreased amount of both - discussed the dangers of tobacco use, both inhaled and oral, which include, but are not limited to cardiovascular disease, increased cancer risk of multiple types of cancer, COPD, peripheral arterial disease, strokes. -  counseled on the benefits of smoking cessation. - firmly advised to quit.  - we also reviewed strategies to maximize success, including:  Removing cigarettes and smoking materials from environment   Stress management  Substitution of other forms of reinforcement (the one cigarette a day approach)  Support of family/friends and group smoking cessation  Selecting a quit date  Patient provided contact information for [facility]uit[facility] or 1-800-QUIT-NOW  Patient provided with Rachael Tanner's 8 free smoking cessation classes: [phone] and [phone]   3 months follow up unless new symptoms or abnormal test results warranting change in plan  Would be reasonable for  Virtual Follow up Would be reasonable for  APP Follow up  Medication Adjustments/Labs and Tests Ordered: Current medicines are reviewed at length with the patient today.  Concerns regarding medicines are outlined above.  No orders of the defined types were placed in this  encounter.  Meds ordered this encounter  Medications  . propranolol ER (INDERAL LA) 60 MG 24 hr capsule    Sig: Take 1 capsule (60 mg total) by mouth daily.    Dispense:  30 capsule    Refill:  5    Patient Instructions  Medication Instructions:  Your physician has recommended you make the following change in your medication:  -- START Propranolol ER 60 mg - Take 1 tablet (60 mg) by mouth daily *If you need a refill on your cardiac medications before your next appointment, please call your pharmacy*  Follow-Up: At [facility], you and your health needs are our priority.  As part of our continuing mission to provide you with exceptional heart care, we have created designated Provider Care Teams.  These Care Teams include your primary Cardiologist (physician) and Advanced Practice Providers (APPs -  Physician Assistants and Nurse Practitioners) who all work together to provide you with the care you need, when you need it.  We  recommend signing up for the patient portal called "MyChart".  Sign up information is provided on this After Visit Summary.  MyChart is used to connect with patients for Virtual Visits (Telemedicine).  Patients are able to view lab/test results, encounter notes, upcoming appointments, etc.  Non-urgent messages can be sent to your provider as well.   To learn more about what you can do with MyChart, go to [email or web address].    Your next appointment:   Your physician recommends that you schedule a follow-up appointment in: 3 MONTHS with Dr. Izora Ribas.   The format for your next appointment:   In Person with Riley Lam, MD        Signed, Christell Constant, MD  02/18/2020 2:04 PM    [facility]

## 2020-02-22 ENCOUNTER — Ambulatory Visit: Payer: Self-pay

## 2020-02-22 NOTE — Telephone Encounter (Signed)
Patient called stating that she has right rib pain and she has had weakness in her right arm and has a sensation of SOB but isn't.  She states that she was Dx with COVID-19 02/11/20.  She states she had no symptoms.  She has no cough and states she has a feeling in her chest that she is congested but she isn't? She states that the rib pain comes when sitting certain ways same with the rt leg cramp. Per protocol patient was asked to go to UC for evaluation.  She is requesting appointment with her PCP. No appointment are available this month.  She still refused UC.  Will route note to office for follow up. Reason for Disposition . [1] Chest pain lasts > 5 minutes AND [2] occurred > 3 days ago (72 hours) AND [3] NO chest pain or cardiac symptoms now  Answer Assessment - Initial Assessment Questions 1. LOCATION: "Where does it hurt?"      Rt side ribs 2. RADIATION: "Does the pain go anywhere else?" (e.g., into neck, jaw, arms, back)     Rt leg cramping tense in arm rt  3. ONSET: "When did the chest pain begin?" (Minutes, hours or days)     2 weeks 4. PATTERN "Does the pain come and go, or has it been constant since it started?"  "Does it get worse with exertion?"      Comes and goes 5. DURATION: "How long does it last" (e.g., seconds, minutes, hours)     Unsure 15 minutes 6. SEVERITY: "How bad is the pain?"  (e.g., Scale 1-10; mild, moderate, or severe)    - MILD (1-3): doesn't interfere with normal activities     - MODERATE (4-7): interferes with normal activities or awakens from sleep    - SEVERE (8-10): excruciating pain, unable to do any normal activities       6 7. CARDIAC RISK FACTORS: "Do you have any history of heart problems or risk factors for heart disease?" (e.g., angina, prior heart attack; diabetes, high blood pressure, high cholesterol, smoker, or strong family history of heart disease)    none 8. PULMONARY RISK FACTORS: "Do you have any history of lung disease?"  (e.g., blood clots in  lung, asthma, emphysema, birth control pills)     Asthma when younger 93. CAUSE: "What do you think is causing the chest pain?"    Unsure rib pain chest feels congested 10. OTHER SYMPTOMS: "Do you have any other symptoms?" (e.g., dizziness, nausea, vomiting, sweating, fever, difficulty breathing, cough)       none 11. PREGNANCY: "Is there any chance you are pregnant?" "When was your last menstrual period?"       Unsure no period since august came off birth control  Protocols used: CHEST PAIN-A-AH

## 2020-02-23 ENCOUNTER — Telehealth: Payer: Self-pay

## 2020-02-23 NOTE — Telephone Encounter (Signed)
Sent pt a Mychart message

## 2020-02-23 NOTE — Telephone Encounter (Signed)
Sent MyChart

## 2020-02-27 ENCOUNTER — Encounter (HOSPITAL_COMMUNITY): Payer: Self-pay

## 2020-02-27 ENCOUNTER — Ambulatory Visit
Admission: EM | Admit: 2020-02-27 | Discharge: 2020-02-27 | Disposition: A | Discharge status: Home / Self Care | Payer: Medicaid Other | Attending: Emergency Medicine | Admitting: Emergency Medicine

## 2020-02-27 ENCOUNTER — Other Ambulatory Visit: Payer: Self-pay

## 2020-02-27 ENCOUNTER — Emergency Department (HOSPITAL_COMMUNITY)
Admission: EM | Admit: 2020-02-27 | Discharge: 2020-02-28 | Disposition: A | Discharge status: Home / Self Care | Payer: Medicaid Other | Attending: Emergency Medicine | Admitting: Emergency Medicine

## 2020-02-27 ENCOUNTER — Emergency Department (HOSPITAL_COMMUNITY): Payer: Medicaid Other

## 2020-02-27 ENCOUNTER — Encounter: Payer: Self-pay | Admitting: Emergency Medicine

## 2020-02-27 ENCOUNTER — Ambulatory Visit (INDEPENDENT_AMBULATORY_CARE_PROVIDER_SITE_OTHER): Payer: Medicaid Other

## 2020-02-27 DIAGNOSIS — R0602 Shortness of breath: Secondary | ICD-10-CM

## 2020-02-27 DIAGNOSIS — Z8616 Personal history of COVID-19: Secondary | ICD-10-CM | POA: Diagnosis not present

## 2020-02-27 DIAGNOSIS — R079 Chest pain, unspecified: Secondary | ICD-10-CM | POA: Insufficient documentation

## 2020-02-27 DIAGNOSIS — R0789 Other chest pain: Secondary | ICD-10-CM

## 2020-02-27 DIAGNOSIS — R059 Cough, unspecified: Secondary | ICD-10-CM | POA: Diagnosis not present

## 2020-02-27 LAB — I-STAT BETA HCG BLOOD, ED (MC, WL, AP ONLY): I-stat hCG, quantitative: 5.7 m[IU]/mL — ABNORMAL HIGH (ref <5)

## 2020-02-27 LAB — TROPONIN I (HIGH SENSITIVITY)
Troponin I (High Sensitivity): <2 ng/L (ref <18)
Troponin I (High Sensitivity): <2 ng/L (ref <18)

## 2020-02-27 LAB — CBC
HCT: 46.5 % — ABNORMAL HIGH (ref 36.0–46.0)
Hemoglobin: 14.5 g/dL (ref 12.0–15.0)
MCH: 26.3 pg (ref 26.0–34.0)
MCHC: 31.2 g/dL (ref 30.0–36.0)
MCV: 84.2 fL (ref 80.0–100.0)
Platelets: 264 10*3/uL (ref 150–400)
RBC: 5.52 MIL/uL — ABNORMAL HIGH (ref 3.87–5.11)
RDW: 12.6 % (ref 11.5–15.5)
WBC: 4.4 10*3/uL (ref 4.0–10.5)
nRBC: 0 % (ref 0.0–0.2)

## 2020-02-27 LAB — BASIC METABOLIC PANEL
Anion gap: 10 (ref 5–15)
BUN: 6 mg/dL (ref 6–20)
CO2: 25 mmol/L (ref 22–32)
Calcium: 9.8 mg/dL (ref 8.9–10.3)
Chloride: 103 mmol/L (ref 98–111)
Creatinine, Ser: 0.81 mg/dL (ref 0.44–1.00)
GFR, Estimated: >60 mL/min (ref >60)
Glucose, Bld: 122 mg/dL — ABNORMAL HIGH (ref 70–99)
Potassium: 4 mmol/L (ref 3.5–5.1)
Sodium: 138 mmol/L (ref 135–145)

## 2020-02-27 LAB — POCT URINE PREGNANCY: Preg Test, Ur: NEGATIVE

## 2020-02-27 MED ORDER — FAMOTIDINE 20 MG PO TABS: 20.0000 mg | ORAL_TABLET | Freq: Two times a day (BID) | ORAL | 0 refills | Status: DC

## 2020-02-27 MED ORDER — NAPROXEN 500 MG PO TABS: 500.0000 mg | ORAL_TABLET | Freq: Two times a day (BID) | ORAL | 0 refills | Status: DC

## 2020-02-27 MED ORDER — TIZANIDINE HCL 4 MG PO TABS: 2.0000 mg | ORAL_TABLET | Freq: Four times a day (QID) | ORAL | 0 refills | Status: DC | PRN

## 2020-02-27 NOTE — Discharge Instructions (Addendum)
Chest xray normal, no pneumonia Naprosyn twice daily for pain May supplement with tizanidine for any muscle cramps/spasms Pepcid twice daily for underlying acid contributing to discomfort and chest Continue to monitor symptoms, follow-up in emergency room if worsening Please follow-up with primary care persisting

## 2020-02-27 NOTE — ED Provider Notes (Addendum)
[name]    CSN: [id number] Arrival date & time: 02/27/20  1510      History   Chief Complaint No chief complaint on file. Chest discomfort   HPI Rachael Tanner is a 20 y.o. female presenting for rib pain. Mainly felt on right side, occassionally on left. Changes with position. Reports cramping in legs. Reports sensation in chest with movement.  Reports chest feeling "hot" yesterday.  Denies cough, fevers, chills.  Patient did have Covid early this January, symptoms have resolved, but patient expresses concern over pneumonia.  Patient recently seen by cardiologist on 1/13 and was prescribed propanolol.  She has had stress testing, echo and all have been normal.  She has PVCs which may be contributing to her occasional palpitations.  She has also had recurrent spells of shortness of breath over the past year where she has been seen in the emergency room and at urgent care, 2 x-rays in the fall which were negative. Also reports leg cramping in legs.  HPI  Past Medical History:  Diagnosis Date  . Anxiety   . Anxiety   . COVID-19   . Marijuana use   . STD (female)   . Tobacco abuse     Patient Active Problem List   Diagnosis Date Noted  . PVC (premature ventricular contraction) 02/18/2020  . Chest pain of uncertain etiology 11/25/2019  . Palpitations 11/25/2019  . Shortness of breath 11/25/2019    History reviewed. No pertinent surgical history.  OB History   No obstetric history on file.      Home Medications    Prior to Admission medications   Medication Sig Start Date End Date Taking? Authorizing Provider  famotidine (PEPCID) 20 MG tablet Take 1 tablet (20 mg total) by mouth 2 (two) times daily. 02/27/20  Yes Wieters, Hallie C, PA-C  naproxen (NAPROSYN) 500 MG tablet Take 1 tablet (500 mg total) by mouth 2 (two) times daily. 02/27/20  Yes Wieters, Hallie C, PA-C  tiZANidine (ZANAFLEX) 4 MG tablet Take 0.5-1 tablets (2-4 mg total) by mouth every 6 (six)  hours as needed for muscle spasms. 02/27/20  Yes Wieters, Hallie C, PA-C  propranolol ER (INDERAL LA) 60 MG 24 hr capsule Take 1 capsule (60 mg total) by mouth daily. 02/18/20   Christell Constant, MD    Family History Family History  Problem Relation Age of Onset  . Hypertension Mother     Social History Social History   Tobacco Use  . Smoking status: Never Smoker  . Smokeless tobacco: Never Used  Vaping Use  . Vaping Use: Never used  Substance Use Topics  . Alcohol use: Yes  . Drug use: Not Currently    Types: Marijuana     Allergies   Patient has no known allergies.   Review of Systems Review of Systems  Constitutional: Negative for activity change, appetite change, chills, fatigue and fever.  HENT: Negative for congestion, ear pain, rhinorrhea, sinus pressure, sore throat and trouble swallowing.   Eyes: Negative for discharge and redness.  Respiratory: Positive for shortness of breath. Negative for cough and chest tightness.   Cardiovascular: Positive for chest pain.  Gastrointestinal: Negative for abdominal pain, diarrhea, nausea and vomiting.  Musculoskeletal: Positive for myalgias.  Skin: Negative for rash.  Neurological: Negative for dizziness, light-headedness and headaches.     Physical Exam Triage Vital Signs ED Triage Vitals  Enc Vitals Group     BP 02/27/20 1534 119/69     Pulse  Rate 02/27/20 1534 97     Resp 02/27/20 1534 16     Temp 02/27/20 1534 99.3 F (37.4 C)     Temp Source 02/27/20 1534 Oral     SpO2 02/27/20 1534 97 %     Weight 02/27/20 1537 157 lb 13.6 oz (71.6 kg)     Height 02/27/20 1537 5\' 6"  (1.676 m)     Head Circumference --      Peak Flow --      Pain Score 02/27/20 1537 3     Pain Loc --      Pain Edu? --      Excl. in GC? --    No data found.  Updated Vital Signs BP 119/69 (BP Location: Left Arm)   Pulse 97   Temp 99.3 F (37.4 C) (Oral)   Resp 16   Ht 5\' 6"  (1.676 m)   Wt 157 lb 13.6 oz (71.6 kg)   SpO2 97%    BMI 25.48 kg/m   Visual Acuity Right Eye Distance:   Left Eye Distance:   Bilateral Distance:    Right Eye Near:   Left Eye Near:    Bilateral Near:     Physical Exam Vitals and nursing note reviewed.  Constitutional:      General: She is not in acute distress.    Appearance: She is well-developed and well-nourished.  HENT:     Head: Normocephalic and atraumatic.     Mouth/Throat:     Comments: Oral mucosa pink and moist, no tonsillar enlargement or exudate. Posterior pharynx patent and nonerythematous, no uvula deviation or swelling. Normal phonation.  Eyes:     Conjunctiva/sclera: Conjunctivae normal.  Cardiovascular:     Rate and Rhythm: Normal rate and regular rhythm.     Heart sounds: No murmur heard.   Pulmonary:     Effort: Pulmonary effort is normal. No respiratory distress.     Breath sounds: Normal breath sounds.     Comments: Breathing comfortably at rest, CTABL, no wheezing, rales or other adventitious sounds auscultated  Reproducible tenderness to palpation of bilateral lower ribs, more prominent on left side Abdominal:     Palpations: Abdomen is soft.     Tenderness: There is no abdominal tenderness.  Musculoskeletal:        General: No edema.     Cervical back: Neck supple.  Skin:    General: Skin is warm and dry.  Neurological:     Mental Status: She is alert.  Psychiatric:        Mood and Affect: Mood and affect normal.      UC Treatments / Results  Labs (all labs ordered are listed, but only abnormal results are displayed) Labs Reviewed  POCT URINE PREGNANCY    EKG   Radiology No results found.  Procedures Procedures (including critical care time)  Medications Ordered in UC Medications - No data to display  Initial Impression / Assessment and Plan / UC Course  I have reviewed the triage vital signs and the nursing notes.  Pertinent labs & imaging results that were available during my care of the patient were reviewed by me  and considered in my medical decision making (see chart for details).     Suspect rib pain most likely chest wall inflammation, lungs clear to auscultation, provided chest x-ray per patient request which was normal.  Recommending anti-inflammatories and muscle relaxers to also help with leg cramps.  Patient stable, vital signs stable.  Trial of  Pepcid to help with any underlying GERD contributing to symptoms.  Did discuss with patient increased risk of DVT/PE after Covid, but have low suspicion at this time.  Advised patient if she has worsening shortness of breath to follow-up in emergency room.  Discussed strict return precautions. Patient verbalized understanding and is agreeable with plan.  Final Clinical Impressions(s) / UC Diagnoses   Final diagnoses:  Chest wall pain     Discharge Instructions     Chest xray normal, no pneumonia Naprosyn twice daily for pain May supplement with tizanidine for any muscle cramps/spasms Pepcid twice daily for underlying acid contributing to discomfort and chest Continue to monitor symptoms, follow-up in emergency room if worsening Please follow-up with primary care persisting    ED Prescriptions    Medication Sig Dispense Auth. Provider   naproxen (NAPROSYN) 500 MG tablet Take 1 tablet (500 mg total) by mouth 2 (two) times daily. 30 tablet Wieters, Hallie C, PA-C   tiZANidine (ZANAFLEX) 4 MG tablet Take 0.5-1 tablets (2-4 mg total) by mouth every 6 (six) hours as needed for muscle spasms. 30 tablet Wieters, Hallie C, PA-C   famotidine (PEPCID) 20 MG tablet Take 1 tablet (20 mg total) by mouth 2 (two) times daily. 30 tablet Wieters, [place] C, PA-C     PDMP not reviewed this encounter.   Lew Dawes, PA-C 03/01/20 0705    Lew Dawes, PA-C 03/01/20 [phone]

## 2020-02-27 NOTE — ED Triage Notes (Signed)
Patient also reports seeing Cardiologist on 02/18/20 for palpitations and was prescribed Propranol but she has not picked up from pharmacy.

## 2020-02-27 NOTE — ED Triage Notes (Signed)
Pt presents with bilateral rib pain, chest "sharpness" mid chest yesterday, low grade temp., pain to bilateral lower legs x 2 wks.

## 2020-02-27 NOTE — ED Triage Notes (Signed)
She tested positive for Covid last of December.

## 2020-02-27 NOTE — ED Triage Notes (Signed)
Pt presents to ED for chest pressure that's been ongoing for chest pain, pt reports he moved to her mid-sternum area yesterday. Pt also reports SOB. UC sent her here to rule out a possible blood clot from covid.

## 2020-02-28 LAB — D-DIMER, QUANTITATIVE: D-Dimer, Quant: <0.27 ug/mL-FEU (ref 0.00–0.50)

## 2020-02-28 NOTE — ED Provider Notes (Signed)
[facility] EMERGENCY DEPARTMENT Provider Note   CSN: [id number] Arrival date & time: 02/27/20  1821     History Chief Complaint  Patient presents with  . Chest Pain    Rachael Tanner is a 20 y.o. female.  Patient here with left-sided chest pains going on for approximately 7 months.  She states that she had COVID last year and shortly afterwards she started having pain in the left side of her chest that would come and go.  Not every day.  Not associated with anything in particular.  She states she had COVID again in December.  She states that since then she has had some intermittent right-sided chest pain as well.  She feels cramps in her extremities.  She went to urgent care where they told her she may have a blood clot and she came here for further evaluation.  She denies any shortness of breath.  She denies any exertional symptoms.  She denies any recent fevers.  The pain is not persistent. No lingering cough. No trauma.    Chest Pain      Past Medical History:  Diagnosis Date  . Anxiety   . Anxiety   . COVID-19   . Marijuana use   . STD (female)   . Tobacco abuse     Patient Active Problem List   Diagnosis Date Noted  . PVC (premature ventricular contraction) 02/18/2020  . Chest pain of uncertain etiology 11/25/2019  . Palpitations 11/25/2019  . Shortness of breath 11/25/2019    History reviewed. No pertinent surgical history.   OB History   No obstetric history on file.     Family History  Problem Relation Age of Onset  . Hypertension Mother     Social History   Tobacco Use  . Smoking status: Never Smoker  . Smokeless tobacco: Never Used  Vaping Use  . Vaping Use: Never used  Substance Use Topics  . Alcohol use: Yes  . Drug use: Not Currently    Types: Marijuana    Home Medications Prior to Admission medications   Medication Sig Start Date End Date Taking? Authorizing Provider  famotidine (PEPCID) 20 MG tablet Take 1 tablet  (20 mg total) by mouth 2 (two) times daily. 02/27/20   Wieters, Hallie C, PA-C  naproxen (NAPROSYN) 500 MG tablet Take 1 tablet (500 mg total) by mouth 2 (two) times daily. 02/27/20   Wieters, Hallie C, PA-C  propranolol ER (INDERAL LA) 60 MG 24 hr capsule Take 1 capsule (60 mg total) by mouth daily. 02/18/20   Chandrasekhar, Rondel Jumbo, MD  tiZANidine (ZANAFLEX) 4 MG tablet Take 0.5-1 tablets (2-4 mg total) by mouth every 6 (six) hours as needed for muscle spasms. 02/27/20   Wieters, Hallie C, PA-C    Allergies    Patient has no known allergies.  Review of Systems   Review of Systems  Cardiovascular: Positive for chest pain.  All other systems reviewed and are negative.   Physical Exam Updated Vital Signs BP 122/68 (BP Location: Left Arm)   Pulse 84   Temp 98.4 F (36.9 C) (Oral)   Resp 16   Ht 5\' 5"  (1.651 m)   Wt 76 kg   SpO2 99%   BMI 27.88 kg/m   Physical Exam Vitals and nursing note reviewed.  Constitutional:      Appearance: She is well-developed and well-nourished.  HENT:     Head: Normocephalic and atraumatic.     Mouth/Throat:  Mouth: Mucous membranes are moist.     Pharynx: Oropharynx is clear.  Eyes:     Pupils: Pupils are equal, round, and reactive to light.  Cardiovascular:     Rate and Rhythm: Normal rate and regular rhythm.  Pulmonary:     Effort: Pulmonary effort is normal. No tachypnea, accessory muscle usage or respiratory distress.     Breath sounds: No stridor.  Chest:     Chest wall: No mass or deformity.  Abdominal:     General: There is no distension.     Palpations: Abdomen is soft.  Musculoskeletal:        General: No swelling or tenderness. Normal range of motion.     Cervical back: Normal range of motion.     Right lower leg: No tenderness. No edema.     Left lower leg: No tenderness. No edema.  Skin:    General: Skin is warm and dry.  Neurological:     General: No focal deficit present.     Mental Status: She is alert.     ED  Results / Procedures / Treatments   Labs (all labs ordered are listed, but only abnormal results are displayed) Labs Reviewed  BASIC METABOLIC PANEL - Abnormal; Notable for the following components:      Result Value   Glucose, Bld 122 (*)    All other components within normal limits  CBC - Abnormal; Notable for the following components:   RBC 5.52 (*)    HCT 46.5 (*)    All other components within normal limits  I-STAT BETA HCG BLOOD, ED ([facility], [facility], [facility] ONLY) - Abnormal; Notable for the following components:   I-stat hCG, quantitative 5.7 (*)    All other components within normal limits  D-DIMER, QUANTITATIVE (NOT AT [facility])  TROPONIN I (HIGH SENSITIVITY)  TROPONIN I (HIGH SENSITIVITY)    EKG EKG Interpretation  Date/Time:  Saturday February 27 2020 18:40:02 EST Ventricular Rate:  101 PR Interval:  136 QRS Duration: 76 QT Interval:  340 QTC Calculation: 440 R Axis:   43 Text Interpretation: Sinus tachycardia with occasional Premature ventricular complexes Right atrial enlargement Borderline ECG No acute changes Confirmed by Marily Memos [phone]) on 02/27/2020 11:04:46 PM   Radiology DG Chest 2 View  Result Date: 02/27/2020 CLINICAL DATA:  Shortness of breath, chest pain, and leg pain. EXAM: CHEST - 2 VIEW [facility]  [facility] FINDINGS: Mild hyperinflation. Normal heart size and pulmonary vascularity. No focal airspace disease or consolidation in the lungs. No blunting of costophrenic angles. No pneumothorax. Mediastinal contours appear intact. IMPRESSION: No active disease. Electronically Signed   By: Burman Nieves M.D.   On: 02/27/2020 19:42   DG Chest 2 View  Result Date: 02/27/2020 CLINICAL DATA:  Try cough, shortness of breath EXAM: CHEST - 2 VIEW COMPARISON:  11/02/2019 FINDINGS: The heart size and mediastinal contours are within normal limits. Relative increased density over the bilateral lung bases is favored to reflect superimposed dense  breast tissue. No focal airspace consolidation, pleural effusion, or pneumothorax. The visualized skeletal structures are unremarkable. IMPRESSION: No active cardiopulmonary disease. Electronically Signed   By: Duanne Guess D.O.   On: 02/27/2020 16:49    Procedures Procedures (including critical care time)  Medications Ordered in ED Medications - No data to display  ED Course  I have reviewed the triage vital signs and the nursing notes.  Pertinent labs & imaging results that were available during my care of  the patient were reviewed by me and considered in my medical decision making (see chart for details).    MDM Rules/Calculators/A&P                          Not clear on etiology of patient's chest pain but is becoming somewhat chronic at this point.  She does cite there was some new symptoms that started with the most recent COVID.  She has no evidence of DVT on exam.  She is very low risk for blood clot however she insisted on having some type of test done to rule it out so a D-dimer was done which was undetectable. Doubt ACS. No e/o pneumonia.    Still do not know what was causing her symptoms but feel its not an emergency at this time and patient stable for discharge for continued follow-up as an outpatient.  Final Clinical Impression(s) / ED Diagnoses Final diagnoses:  Nonspecific chest pain    Rx / DC Orders ED Discharge Orders    None       Mehmet Scally, Barbara Cower, MD 02/28/20 0425

## 2020-03-01 ENCOUNTER — Telehealth: Payer: Self-pay

## 2020-03-01 NOTE — Telephone Encounter (Signed)
Transition Care Management Follow-up Telephone Call  Date of discharge and from where: 02/28/2020 from [facility]  How have you been since you were released from the hospital? Patient states that she is feeling okay.   Any questions or concerns? Yes  Patient had a question about buspar. Informed pt that PCP changed her therapy to propanolol on 02/18/2020.   Items Reviewed:  Did the pt receive and understand the discharge instructions provided? Yes   Medications obtained and verified? Yes   Other? No   Any new allergies since your discharge? No   Dietary orders reviewed? N/A  Do you have support at home? Yes   Functional Questionnaire: (I = Independent and D = Dependent) ADLs: I  Bathing/Dressing- I  Meal Prep- I  Eating- I  Maintaining continence- I  Transferring/Ambulation- I  Managing Meds- I  Follow up appointments reviewed:   PCP Hospital f/u appt confirmed? Pt will call PCP  Are transportation arrangements needed? No   If their condition worsens, is the pt aware to call PCP or go to the Emergency Dept.? Yes Was the patient provided with contact information for the PCP's office or ED? Yes Was to pt encouraged to call back with questions or concerns? Yes

## 2020-03-04 ENCOUNTER — Ambulatory Visit: Payer: Self-pay

## 2020-03-04 NOTE — Telephone Encounter (Signed)
Will attempt to schedule patient about the post covid center.

## 2020-03-04 NOTE — Telephone Encounter (Signed)
Patient called stating that she took tylenol pm for sleep last night because she has been cramping.  She is reporting SOB when she woke today.  She states that she has chest pain left chest under her breast.  She states that she has had ongoing since Dx with COVID-19. She states that a few weeks ago they tested her for a blood clot because of rib pain and center chest pain and leg cramps. She reports that she has recently come off depo and had not had her period in several months and has taken a pregnancy test that was negative just a few days ago. SOB now comes and goes. Per protocol patient will go to UC to be evaluated. She states she has been trying to get OV but none available. She was urged to call Monday for follow up appointment.  Reason for Disposition . Taking a deep breath makes pain worse  Answer Assessment - Initial Assessment Questions 1. RESPIRATORY STATUS: "Describe your breathing?" (e.g., wheezing, shortness of breath, unable to speak, severe coughing)      SOB 2. ONSET: "When did this breathing problem begin?"      Today waking up 3. PATTERN "Does the difficult breathing come and go, or has it been constant since it started?"     Comes and goes 4. SEVERITY: "How bad is your breathing?" (e.g., mild, moderate, severe)    - MILD: No SOB at rest, mild SOB with walking, speaks normally in sentences, can lay down, no retractions, pulse < 100.    - MODERATE: SOB at rest, SOB with minimal exertion and prefers to sit, cannot lie down flat, speaks in phrases, mild retractions, audible wheezing, pulse 100-120.    - SEVERE: Very SOB at rest, speaks in single words, struggling to breathe, sitting hunched forward, retractions, pulse > 120      mild 5. RECURRENT SYMPTOM: "Have you had difficulty breathing before?" If Yes, ask: "When was the last time?" and "What happened that time?"      no 6. CARDIAC HISTORY: "Do you have any history of heart disease?" (e.g., heart attack, angina, bypass  surgery, angioplasty)     no 7. LUNG HISTORY: "Do you have any history of lung disease?"  (e.g., pulmonary embolus, asthma, emphysema)     asthma 8. CAUSE: "What do you think is causing the breathing problem?"     Unsure just recovered from COVID-19 9. OTHER SYMPTOMS: "Do you have any other symptoms? (e.g., dizziness, runny nose, cough, chest pain, fever)    none 10. PREGNANCY: "Is there any chance you are pregnant?" "When was your last menstrual period?"      No test negative a few days goes 11. TRAVEL: "Have you traveled out of the country in the last month?" (e.g., travel history, exposures)      N/A  Answer Assessment - Initial Assessment Questions 1. COVID-19 ONSET: "When did the symptoms of COVID-19 first start?"     End of december 2. DIAGNOSIS CONFIRMATION: "How were you diagnosed?" (e.g., COVID-19 oral or nasal viral test; COVID-19 antibody test; doctor visit)    tested 3. MAIN SYMPTOM:  "What is your main concern or symptom right now?" (e.g., breathing difficulty, cough, fatigue. loss of smell)    SOB 4. SYMPTOM ONSET: "When did the  sob start?"     This am 5. BETTER-SAME-WORSE: "Are you getting better, staying the same, or getting worse over the last 1 to 2 weeks?"     same 6. RECENT MEDICAL  VISIT: "Have you been seen by a healthcare provider (doctor, NP, PA) for these persisting COVID-19 symptoms?" If Yes, ask: "When were you seen?" (e.g., date)     no 7. COUGH: "Do you have a cough?" If Yes, ask: "How bad is the cough?"       no 8. FEVER: "Do you have a fever?" If Yes, ask: "What is your temperature, how was it measured, and when did it start?"     no 9. BREATHING DIFFICULTY: "Are you having any trouble breathing?" If Yes, ask: "How bad is your breathing?" (e.g., mild, moderate, severe)    - MILD: No SOB at rest, mild SOB with walking, speaks normally in sentences, can lie down, no retractions, pulse < 100.    - MODERATE: SOB at rest, SOB with minimal exertion and prefers  to sit, cannot lie down flat, speaks in phrases, mild retractions, audible wheezing, pulse 100-120.    - SEVERE: Very SOB at rest, speaks in single words, struggling to breathe, sitting hunched forward, retractions, pulse > 120       Noticeable comes and goes 10. HIGH RISK DISEASE: "Do you have any chronic medical problems?" (e.g., asthma, heart or lung disease, weak immune system, obesity, etc.)       asthma 11. VACCINE: "Have you gotten the COVID-19 vaccine?" If Yes ask: "Which one, how many shots, when did you get it?"       no 12. PREGNANCY: "Is there any chance you are pregnant?" "When was your last menstrual period?"       Tested a few days ago negative 13. OTHER SYMPTOMS: "Do you have any other symptoms?"  (e.g., fatigue, headache, muscle pain, weakness)       none  Answer Assessment - Initial Assessment Questions 1. LOCATION: "Where does it hurt?"       Chest under left breast 2. RADIATION: "Does the pain go anywhere else?" (e.g., into neck, jaw, arms, back)     no 3. ONSET: "When did the chest pain begin?" (Minutes, hours or days)     With covid 4. PATTERN "Does the pain come and go, or has it been constant since it started?"  "Does it get worse with exertion?"      Comes with breaths rigidy 5. DURATION: "How long does it last" (e.g., seconds, minutes, hours)     With each breath for a couple minutes 6. SEVERITY: "How bad is the pain?"  (e.g., Scale 1-10; mild, moderate, or severe)    - MILD (1-3): doesn't interfere with normal activities     - MODERATE (4-7): interferes with normal activities or awakens from sleep    - SEVERE (8-10): excruciating pain, unable to do any normal activities       4 7. CARDIAC RISK FACTORS: "Do you have any history of heart problems or risk factors for heart disease?" (e.g., angina, prior heart attack; diabetes, high blood pressure, high cholesterol, smoker, or strong family history of heart disease)     Smokes some 8. PULMONARY RISK FACTORS: "Do  you have any history of lung disease?"  (e.g., blood clots in lung, asthma, emphysema, birth control pills)   Asthma tested for blood clot for these symptoms 9. CAUSE: "What do you think is causing the chest pain?"     Unsure  10. OTHER SYMPTOMS: "Do you have any other symptoms?" (e.g., dizziness, nausea, vomiting, sweating, fever, difficulty breathing, cough)       SOB feeling 11. PREGNANCY: "Is there any chance you  are pregnant?" "When was your last menstrual period?"      No took test a few days ago  Protocols used: CHEST PAIN-A-AH, BREATHING DIFFICULTY-A-AH, CORONAVIRUS (COVID-19) PERSISTING SYMPTOMS FOLLOW-UP CALL-A-AH

## 2020-03-06 ENCOUNTER — Emergency Department (HOSPITAL_COMMUNITY)
Admission: EM | Admit: 2020-03-06 | Discharge: 2020-03-07 | Disposition: A | Discharge status: Home / Self Care | Payer: Medicaid Other | Attending: Emergency Medicine | Admitting: Emergency Medicine

## 2020-03-06 ENCOUNTER — Other Ambulatory Visit: Payer: Self-pay

## 2020-03-06 ENCOUNTER — Emergency Department (HOSPITAL_COMMUNITY): Payer: Medicaid Other

## 2020-03-06 ENCOUNTER — Encounter (HOSPITAL_COMMUNITY): Payer: Self-pay | Admitting: Emergency Medicine

## 2020-03-06 DIAGNOSIS — R0602 Shortness of breath: Secondary | ICD-10-CM | POA: Insufficient documentation

## 2020-03-06 DIAGNOSIS — R0789 Other chest pain: Secondary | ICD-10-CM | POA: Diagnosis not present

## 2020-03-06 DIAGNOSIS — Z8616 Personal history of COVID-19: Secondary | ICD-10-CM | POA: Diagnosis not present

## 2020-03-06 DIAGNOSIS — R079 Chest pain, unspecified: Secondary | ICD-10-CM | POA: Diagnosis not present

## 2020-03-06 LAB — BASIC METABOLIC PANEL
Anion gap: 15 (ref 5–15)
BUN: 12 mg/dL (ref 6–20)
CO2: 21 mmol/L — ABNORMAL LOW (ref 22–32)
Calcium: 10.6 mg/dL — ABNORMAL HIGH (ref 8.9–10.3)
Chloride: 104 mmol/L (ref 98–111)
Creatinine, Ser: 0.81 mg/dL (ref 0.44–1.00)
GFR, Estimated: >60 mL/min (ref >60)
Glucose, Bld: 81 mg/dL (ref 70–99)
Potassium: 3.8 mmol/L (ref 3.5–5.1)
Sodium: 140 mmol/L (ref 135–145)

## 2020-03-06 LAB — CBC
HCT: 51.2 % — ABNORMAL HIGH (ref 36.0–46.0)
Hemoglobin: 16.3 g/dL — ABNORMAL HIGH (ref 12.0–15.0)
MCH: 26.6 pg (ref 26.0–34.0)
MCHC: 31.8 g/dL (ref 30.0–36.0)
MCV: 83.5 fL (ref 80.0–100.0)
Platelets: 281 10*3/uL (ref 150–400)
RBC: 6.13 MIL/uL — ABNORMAL HIGH (ref 3.87–5.11)
RDW: 12.8 % (ref 11.5–15.5)
WBC: 5.2 10*3/uL (ref 4.0–10.5)
nRBC: 0 % (ref 0.0–0.2)

## 2020-03-06 LAB — I-STAT BETA HCG BLOOD, ED (MC, WL, AP ONLY): I-stat hCG, quantitative: <5 m[IU]/mL (ref <5)

## 2020-03-06 LAB — TROPONIN I (HIGH SENSITIVITY): Troponin I (High Sensitivity): <2 ng/L (ref <18)

## 2020-03-06 NOTE — ED Notes (Signed)
Patient states she might have to leave soon and wanted to know how to talk to doctor without staying. Informed patient staying to be seen is the only way to speak with a provider. Stated if she signed up for mychart she could see her lab results but that would not be a definitive diagnosis. States she will try to stay a little longer

## 2020-03-06 NOTE — ED Triage Notes (Signed)
Pt seen in ED 1/22 for chest pain.  Reports ongoing L sided chest pain, HR up to 119, and SOB.  Denies nausea and vomiting.

## 2020-03-07 NOTE — ED Provider Notes (Signed)
[facility] EMERGENCY DEPARTMENT Provider Note   CSN: [id number] Arrival date & time: 03/06/20  1831     History Chief Complaint  Patient presents with  . Chest Pain    [facility] is a 20 y.o. female.  Patient with history of anxiety, Covid end of December 2021, marijuana use presents with intermittent brief chest discomfort and shortness of breath.  Patient's had this intermittent for a year now.  Nonexertional, at times positional, at times with palpation.  Currently no shortness of breath or significant chest pain.  Patient had work-up for similar recently for signs of heart attack and blood clot which were negative.  No cardiac history, no history of blood clots, no recent travel, no leg swelling, no recent surgeries.  Patient denies current estrogen use.        Past Medical History:  Diagnosis Date  . Anxiety   . Anxiety   . COVID-19   . Marijuana use   . STD (female)   . Tobacco abuse     Patient Active Problem List   Diagnosis Date Noted  . PVC (premature ventricular contraction) 02/18/2020  . Chest pain of uncertain etiology 11/25/2019  . Palpitations 11/25/2019  . Shortness of breath 11/25/2019    History reviewed. No pertinent surgical history.   OB History   No obstetric history on file.     Family History  Problem Relation Age of Onset  . Hypertension Mother     Social History   Tobacco Use  . Smoking status: Never Smoker  . Smokeless tobacco: Never Used  Vaping Use  . Vaping Use: Never used  Substance Use Topics  . Alcohol use: Yes  . Drug use: Not Currently    Types: Marijuana    Home Medications Prior to Admission medications   Medication Sig Start Date End Date Taking? Authorizing Provider  famotidine (PEPCID) 20 MG tablet Take 1 tablet (20 mg total) by mouth 2 (two) times daily. 02/27/20   Wieters, Hallie C, PA-C  naproxen (NAPROSYN) 500 MG tablet Take 1 tablet (500 mg total) by mouth 2 (two) times daily.  02/27/20   Wieters, Hallie C, PA-C  propranolol ER (INDERAL LA) 60 MG 24 hr capsule Take 1 capsule (60 mg total) by mouth daily. 02/18/20   Chandrasekhar, Rondel Jumbo, MD  tiZANidine (ZANAFLEX) 4 MG tablet Take 0.5-1 tablets (2-4 mg total) by mouth every 6 (six) hours as needed for muscle spasms. 02/27/20   Wieters, Hallie C, PA-C    Allergies    Patient has no known allergies.  Review of Systems   Review of Systems  Constitutional: Negative for chills and fever.  HENT: Negative for congestion.   Eyes: Negative for visual disturbance.  Respiratory: Negative for shortness of breath.   Cardiovascular: Positive for chest pain.  Gastrointestinal: Negative for abdominal pain and vomiting.  Genitourinary: Negative for dysuria and flank pain.  Musculoskeletal: Negative for back pain, neck pain and neck stiffness.  Skin: Negative for rash.  Neurological: Negative for light-headedness and headaches.    Physical Exam Updated Vital Signs BP 121/84 (BP Location: Left Arm)   Pulse 98   Temp 99.2 F (37.3 C) (Oral)   Resp 18   Ht 5\' 5"  (1.651 m)   Wt 71.2 kg   SpO2 100%   BMI 26.13 kg/m   Physical Exam Vitals and nursing note reviewed.  Constitutional:      Appearance: She is well-developed and well-nourished.  HENT:  Head: Normocephalic and atraumatic.  Eyes:     General:        Right eye: No discharge.        Left eye: No discharge.     Conjunctiva/sclera: Conjunctivae normal.  Neck:     Trachea: No tracheal deviation.  Cardiovascular:     Rate and Rhythm: Normal rate and regular rhythm.     Heart sounds: Normal heart sounds. Heart sounds not distant.   No systolic murmur is present.  No diastolic murmur is present.   Pulmonary:     Effort: Pulmonary effort is normal.     Breath sounds: Normal breath sounds.  Abdominal:     General: There is no distension.     Palpations: Abdomen is soft.     Tenderness: There is no abdominal tenderness. There is no guarding.   Musculoskeletal:        General: No edema. Normal range of motion.     Cervical back: Normal range of motion and neck supple.     Right lower leg: No tenderness. No edema.     Left lower leg: No tenderness. No edema.  Skin:    General: Skin is warm.     Capillary Refill: Capillary refill takes less than 2 seconds.     Findings: No rash.  Neurological:     General: No focal deficit present.     Mental Status: She is alert and oriented to person, place, and time.  Psychiatric:        Mood and Affect: Mood is anxious.     ED Results / Procedures / Treatments   Labs (all labs ordered are listed, but only abnormal results are displayed) Labs Reviewed  BASIC METABOLIC PANEL - Abnormal; Notable for the following components:      Result Value   CO2 21 (*)    Calcium 10.6 (*)    All other components within normal limits  CBC - Abnormal; Notable for the following components:   RBC 6.13 (*)    Hemoglobin 16.3 (*)    HCT 51.2 (*)    All other components within normal limits  I-STAT BETA HCG BLOOD, ED ([facility], [facility], [facility] ONLY)  TROPONIN I (HIGH SENSITIVITY)  TROPONIN I (HIGH SENSITIVITY)    EKG EKG Interpretation  Date/Time:  Monday March 07 2020 00:36:30 EST Ventricular Rate:  88 PR Interval:    QRS Duration: 70 QT Interval:  373 QTC Calculation: 452 R Axis:   64 Text Interpretation: Sinus rhythm Confirmed by Blane Ohara [phone]) on 03/07/2020 12:40:50 AM   Radiology DG Chest 2 View  Result Date: 03/06/2020 CLINICAL DATA:  Chest pain. EXAM: CHEST - 2 VIEW COMPARISON:  02/27/2020 FINDINGS: Normal sized heart. Clear lungs with normal vascularity. Stable mild peribronchial thickening. Unremarkable bones. IMPRESSION: Stable mild chronic bronchitic changes. Electronically Signed   By: Beckie Salts M.D.   On: 03/06/2020 19:56    Procedures Procedures   Medications Ordered in ED Medications - No data to display  ED Course  I have reviewed the triage vital signs and the nursing  notes.  Pertinent labs & imaging results that were available during my care of the patient were reviewed by me and considered in my medical decision making (see chart for details).    MDM Rules/Calculators/A&P                          Well-appearing adult presents with atypical intermittent chest pain for 1  year.  Discussed differential diagnosis including atypical ACS, blood clot, pericarditis with recent Covid infection, reflux, musculoskeletal, anxiety, other.  Patient very low risk for serious pathology, reviewed medical records and patient had negative D-dimer recently for identical symptoms, very low risk for blood clots/Wells very low.  Very low risk ACS, negative troponin, atypical, nonexertional.  Discussed possible inflammatory cause versus other.  Patient has outpatient follow-up.  Blood work reviewed normal hemoglobin, normal white blood cell count, negative troponin.  Chest x-ray reviewed no acute findings.  EKG reviewed no signs of ischemia.  [facility] was evaluated in Emergency Department on 03/07/2020 for the symptoms described in the history of present illness. She was evaluated in the context of the global COVID-19 pandemic, which necessitated consideration that the patient might be at risk for infection with the SARS-CoV-2 virus that causes COVID-19. Institutional protocols and algorithms that pertain to the evaluation of patients at risk for COVID-19 are in a state of rapid change based on information released by regulatory bodies including the CDC and federal and state organizations. These policies and algorithms were followed during the patient's care in the ED.   Final Clinical Impression(s) / ED Diagnoses Final diagnoses:  Atypical chest pain    Rx / DC Orders ED Discharge Orders    None       Blane Ohara, MD 03/07/20 [phone]

## 2020-03-07 NOTE — Discharge Instructions (Addendum)
Take ibuprofen 600 mg every 6 hours as needed for pain.  Follow-up with your local doctor if symptoms persist or worsen.

## 2020-03-08 ENCOUNTER — Telehealth: Payer: Self-pay | Admitting: *Deleted

## 2020-03-08 NOTE — Telephone Encounter (Signed)
Transition Care Management Unsuccessful Follow-up Telephone Call  Date of discharge and from where:  03/07/[facility]  Attempts:  1st Attempt  Reason for unsuccessful TCM follow-up call:  Unable to reach patient - no VM

## 2020-03-09 NOTE — Telephone Encounter (Signed)
Transition Care Management Unsuccessful Follow-up Telephone Call  Date of discharge and from where:  03/07/2020 [facility]  Attempts:  2nd Attempt  Reason for unsuccessful TCM follow-up call:  Unable to reach patient - no VM

## 2020-03-10 NOTE — Telephone Encounter (Signed)
Transition Care Management Unsuccessful Follow-up Telephone Call  Date of discharge and from where:  03/07/2020 [facility]  Attempts:  3rd Attempt  Reason for unsuccessful TCM follow-up call:  Unable to leave message - no VM

## 2020-03-23 DIAGNOSIS — R110 Nausea: Secondary | ICD-10-CM | POA: Diagnosis not present

## 2020-03-24 ENCOUNTER — Encounter: Payer: Self-pay | Admitting: Internal Medicine

## 2020-03-24 ENCOUNTER — Ambulatory Visit: Payer: Medicaid Other | Attending: Internal Medicine | Admitting: Internal Medicine

## 2020-03-24 ENCOUNTER — Other Ambulatory Visit: Payer: Self-pay

## 2020-03-24 ENCOUNTER — Ambulatory Visit: Payer: Medicaid Other | Admitting: Internal Medicine

## 2020-03-24 VITALS — BP 109/73 | HR 98 | Resp 16 | Wt 161.2 lb

## 2020-03-24 DIAGNOSIS — F17200 Nicotine dependence, unspecified, uncomplicated: Secondary | ICD-10-CM | POA: Diagnosis not present

## 2020-03-24 DIAGNOSIS — R002 Palpitations: Secondary | ICD-10-CM | POA: Diagnosis not present

## 2020-03-24 DIAGNOSIS — R0781 Pleurodynia: Secondary | ICD-10-CM | POA: Insufficient documentation

## 2020-03-24 DIAGNOSIS — R079 Chest pain, unspecified: Secondary | ICD-10-CM | POA: Insufficient documentation

## 2020-03-24 DIAGNOSIS — Z8616 Personal history of COVID-19: Secondary | ICD-10-CM | POA: Diagnosis not present

## 2020-03-24 DIAGNOSIS — F17210 Nicotine dependence, cigarettes, uncomplicated: Secondary | ICD-10-CM | POA: Insufficient documentation

## 2020-03-24 DIAGNOSIS — M25471 Effusion, right ankle: Secondary | ICD-10-CM

## 2020-03-24 DIAGNOSIS — M25472 Effusion, left ankle: Secondary | ICD-10-CM

## 2020-03-24 DIAGNOSIS — D751 Secondary polycythemia: Secondary | ICD-10-CM | POA: Insufficient documentation

## 2020-03-24 DIAGNOSIS — R600 Localized edema: Secondary | ICD-10-CM | POA: Diagnosis not present

## 2020-03-24 DIAGNOSIS — I493 Ventricular premature depolarization: Secondary | ICD-10-CM | POA: Diagnosis not present

## 2020-03-24 DIAGNOSIS — R0602 Shortness of breath: Secondary | ICD-10-CM | POA: Insufficient documentation

## 2020-03-24 NOTE — Progress Notes (Signed)
Pt is wanting provider to go over hospital lab work

## 2020-03-24 NOTE — Progress Notes (Signed)
Patient ID: [name], female    DOB: 03-13-2000  MRN: [id number]  CC: Hospitalization Follow-up (ED )   Subjective: [facility] is a 20 y.o. female who presents for f/u visit Her concerns today include:  Pt with hx of anxiety, recurrent CP, mastalgia, COVID infection 02/2019, tob dep  Patient seen in the emergency room 3 times within the past month with various complaints including rib pain and chest pain.  She noted on blood test that were done in the emergency room that her red blood cell and hemoglobin levels were elevated.  She wanted to follow-up on that today.  She denies loud snoring or daytime sleepiness.  She smokes cigars but states she does not smoke them every day probably every other day.  She feels she does not drink enough fluids during the day.    She had follow-up with the cardiologist Dr. Izora Ribas last month for palpitations and chest pains.  Her work-up included normal echo, normal exercise stress test, and heart monitor that revealed occasional sometimes symptomatic PVCs.  He started on propranolol.  However patient has not started the medication as yet because she was taking ibuprofen and she was not sure whether she can take both of them together.  She still gets symptomatic palpitations intermittently.  Reports that her ankles get swollen intermittently during the day and sometimes feels short of breath when she walks to her car.  She has pictures on her phone showing me where her ankles were swollen at times.   3 times in the past few weeks she woke up feeling warm in her chest and that subsequently spread it all over her body.  She was given a prescription for Pepcid from the emergency room but she has not filled that as yet either because she was not sure that the symptoms were acid reflux.  She denies any burning in the stomach or after certain meals. I had prescribed BuSpar for her in September of last year for anxiety symptoms.  However she never took the  medication.   Patient Active Problem List   Diagnosis Date Noted  . PVC (premature ventricular contraction) 02/18/2020  . Chest pain of uncertain etiology 11/25/2019  . Palpitations 11/25/2019  . Shortness of breath 11/25/2019     Current Outpatient Medications on File Prior to Visit  Medication Sig Dispense Refill  . famotidine (PEPCID) 20 MG tablet Take 1 tablet (20 mg total) by mouth 2 (two) times daily. (Patient not taking: Reported on 03/24/2020) 30 tablet 0  . naproxen (NAPROSYN) 500 MG tablet Take 1 tablet (500 mg total) by mouth 2 (two) times daily. (Patient not taking: Reported on 03/24/2020) 30 tablet 0  . propranolol ER (INDERAL LA) 60 MG 24 hr capsule Take 1 capsule (60 mg total) by mouth daily. (Patient not taking: Reported on 03/24/2020) 30 capsule 5  . tiZANidine (ZANAFLEX) 4 MG tablet Take 0.5-1 tablets (2-4 mg total) by mouth every 6 (six) hours as needed for muscle spasms. (Patient not taking: Reported on 03/24/2020) 30 tablet 0   No current facility-administered medications on file prior to visit.    No Known Allergies  Social History   Socioeconomic History  . Marital status: Single    Spouse name: Not on file  . Number of children: Not on file  . Years of education: Not on file  . Highest education level: Not on file  Occupational History  . Not on file  Tobacco Use  . Smoking status:  Never Smoker  . Smokeless tobacco: Never Used  Vaping Use  . Vaping Use: Never used  Substance and Sexual Activity  . Alcohol use: Yes  . Drug use: Not Currently    Types: Marijuana  . Sexual activity: Not on file  Other Topics Concern  . Not on file  Social History Narrative  . Not on file   Social Determinants of Health   Financial Resource Strain: Not on file  Food Insecurity: Not on file  Transportation Needs: Not on file  Physical Activity: Not on file  Stress: Not on file  Social Connections: Not on file  Intimate Partner Violence: Not on file    Family  History  Problem Relation Age of Onset  . Hypertension Mother     No past surgical history on file.  ROS: Review of Systems Negative except as stated above  PHYSICAL EXAM: BP 109/73   Pulse 98   Resp 16   Wt 161 lb 3.2 oz (73.1 kg)   SpO2 97%   BMI 26.83 kg/m   Physical Exam  General appearance - alert, well appearing, and in no distress Mental status - normal mood, behavior, speech, dress, motor activity, and thought processes Chest - clear to auscultation, no wheezes, rales or rhonchi, symmetric air entry Heart - normal rate, regular rhythm, normal S1, S2, no murmurs, rubs, clicks or gallops Abdomen - soft, nontender, nondistended, no masses or organomegaly Extremities - peripheral pulses normal, no pedal edema, no clubbing or cyanosis   CMP Latest Ref Rng & Units 03/06/2020 02/27/2020 03/22/2019  Glucose 70 - 99 mg/dL 81 176(H) 81  BUN 6 - 20 mg/dL 12 6 8   Creatinine 0.44 - 1.00 mg/dL 6.07 3.71  Sodium 135 - 145 mmol/L 140 138 139  Potassium 3.5 - 5.1 mmol/L 3.8 4.0 3.4(L)  Chloride 98 - 111 mmol/L 104 103 105  CO2 22 - 32 mmol/L 21(L) 25 23  Calcium 8.9 - 10.3 mg/dL 10.6(H) 9.8 8.9   Lipid Panel  No results found for: CHOL, TRIG, HDL, CHOLHDL, VLDL, LDLCALC, LDLDIRECT  CBC    Component Value Date/Time   WBC 5.2 03/06/2020 2013   RBC 6.13 (H) 03/06/2020 2013   HGB 16.3 (H) 03/06/2020 2013   HCT 51.2 (H) 03/06/2020 2013   PLT 281 03/06/2020 2013   MCV 83.5 03/06/2020 2013   MCH 26.6 03/06/2020 2013   MCHC 31.8 03/06/2020 2013   RDW 12.8 03/06/2020 2013   LYMPHSABS 1.5 03/22/2019 2018   MONOABS 0.3 03/22/2019 2018   EOSABS 0.1 03/22/2019 2018   BASOSABS 0.0 03/22/2019 2018    ASSESSMENT AND PLAN: 1. Polycythemia Discussed primary versus secondary polycythemia with her. Does not have symptoms to suggest sleep apnea. I have encouraged her to discontinue smoking. We will check an erythropoietin level and recheck her CBC for starters. - CBC -  Erythropoietin  2. Smoker Advised to quit.  Discussed health risks associated with smoking.  Less than 5 minutes spent on counseling.  3. Palpitations Advised patient to go ahead and start the propranolol as prescribed by the cardiologist and that it is okay to take it with ibuprofen.  4. Edema of both ankles No swelling in the legs today but on the pitches on her phone she does appear to have some swelling in the ankles and feet.  I note that she wears short tube socks that stop at the ankle.  She does a lot of standing at work.  I recommend wearing support socks  that come up below the knee.    Patient was given the opportunity to ask questions.  Patient verbalized understanding of the plan and was able to repeat key elements of the plan.   No orders of the defined types were placed in this encounter.    Requested Prescriptions    No prescriptions requested or ordered in this encounter    No follow-ups on file.  Jonah Blue, MD, FACP

## 2020-03-24 NOTE — Patient Instructions (Signed)
I encourage you to quit smoking.  You can start the propranolol to help decrease the palpitations.  I recommend wearing below the knee socks when you have to do a lot of standing at work.  Try to limit salt in the food as much as possible.

## 2020-03-25 ENCOUNTER — Telehealth: Payer: Self-pay | Admitting: Internal Medicine

## 2020-03-25 LAB — ERYTHROPOIETIN: Erythropoietin: 13.9 m[IU]/mL (ref 2.6–18.5)

## 2020-03-25 NOTE — Telephone Encounter (Signed)
Results have not been released as of yet by PCP. MA will call once PCP releases the results to let her know what the test indicate.

## 2020-03-25 NOTE — Telephone Encounter (Signed)
Patient is calling to receive her lab results. Please advise CB- [phone]

## 2020-03-29 DIAGNOSIS — F17200 Nicotine dependence, unspecified, uncomplicated: Secondary | ICD-10-CM | POA: Insufficient documentation

## 2020-03-29 DIAGNOSIS — D751 Secondary polycythemia: Secondary | ICD-10-CM | POA: Insufficient documentation

## 2020-03-31 NOTE — Telephone Encounter (Unsigned)
Copied from CRM [id number]. Topic: General - Call Back - No Documentation >> Mar 31, 2020  3:12 PM Randol Kern wrote: Reason for CRM: Pt is still waiting on her lab results, please advise Best contact: [phone]

## 2020-03-31 NOTE — Telephone Encounter (Signed)
Pt reviewed lab on Mychart on 2/19 after provider resulting them...  Returned pt call but didn't answer was unable to lvm

## 2020-04-01 ENCOUNTER — Encounter: Payer: Self-pay | Admitting: Internal Medicine

## 2020-04-01 DIAGNOSIS — D751 Secondary polycythemia: Secondary | ICD-10-CM

## 2020-04-05 ENCOUNTER — Ambulatory Visit: Payer: Medicaid Other | Attending: Internal Medicine

## 2020-04-05 ENCOUNTER — Other Ambulatory Visit: Payer: Self-pay

## 2020-04-05 DIAGNOSIS — D751 Secondary polycythemia: Secondary | ICD-10-CM

## 2020-04-06 LAB — CBC
Hematocrit: 42.8 % (ref 34.0–46.6)
Hemoglobin: 14 g/dL (ref 11.1–15.9)
MCH: 26.9 pg (ref 26.6–33.0)
MCHC: 32.7 g/dL (ref 31.5–35.7)
MCV: 82 fL (ref 79–97)
Platelets: 234 10*3/uL (ref 150–450)
RBC: 5.21 x10E6/uL (ref 3.77–5.28)
RDW: 13 % (ref 11.7–15.4)
WBC: 5 10*3/uL (ref 3.4–10.8)

## 2020-04-07 IMAGING — DX DG CHEST 1V PORT
1 series · 1 of 1 positions shown · non-contrast
Comparison: Chest x-ray dated 09/28/2013.

CLINICAL DATA: Shortness of breath cough, sore throat.

EXAM:
PORTABLE CHEST 1 VIEW

[chest ap]
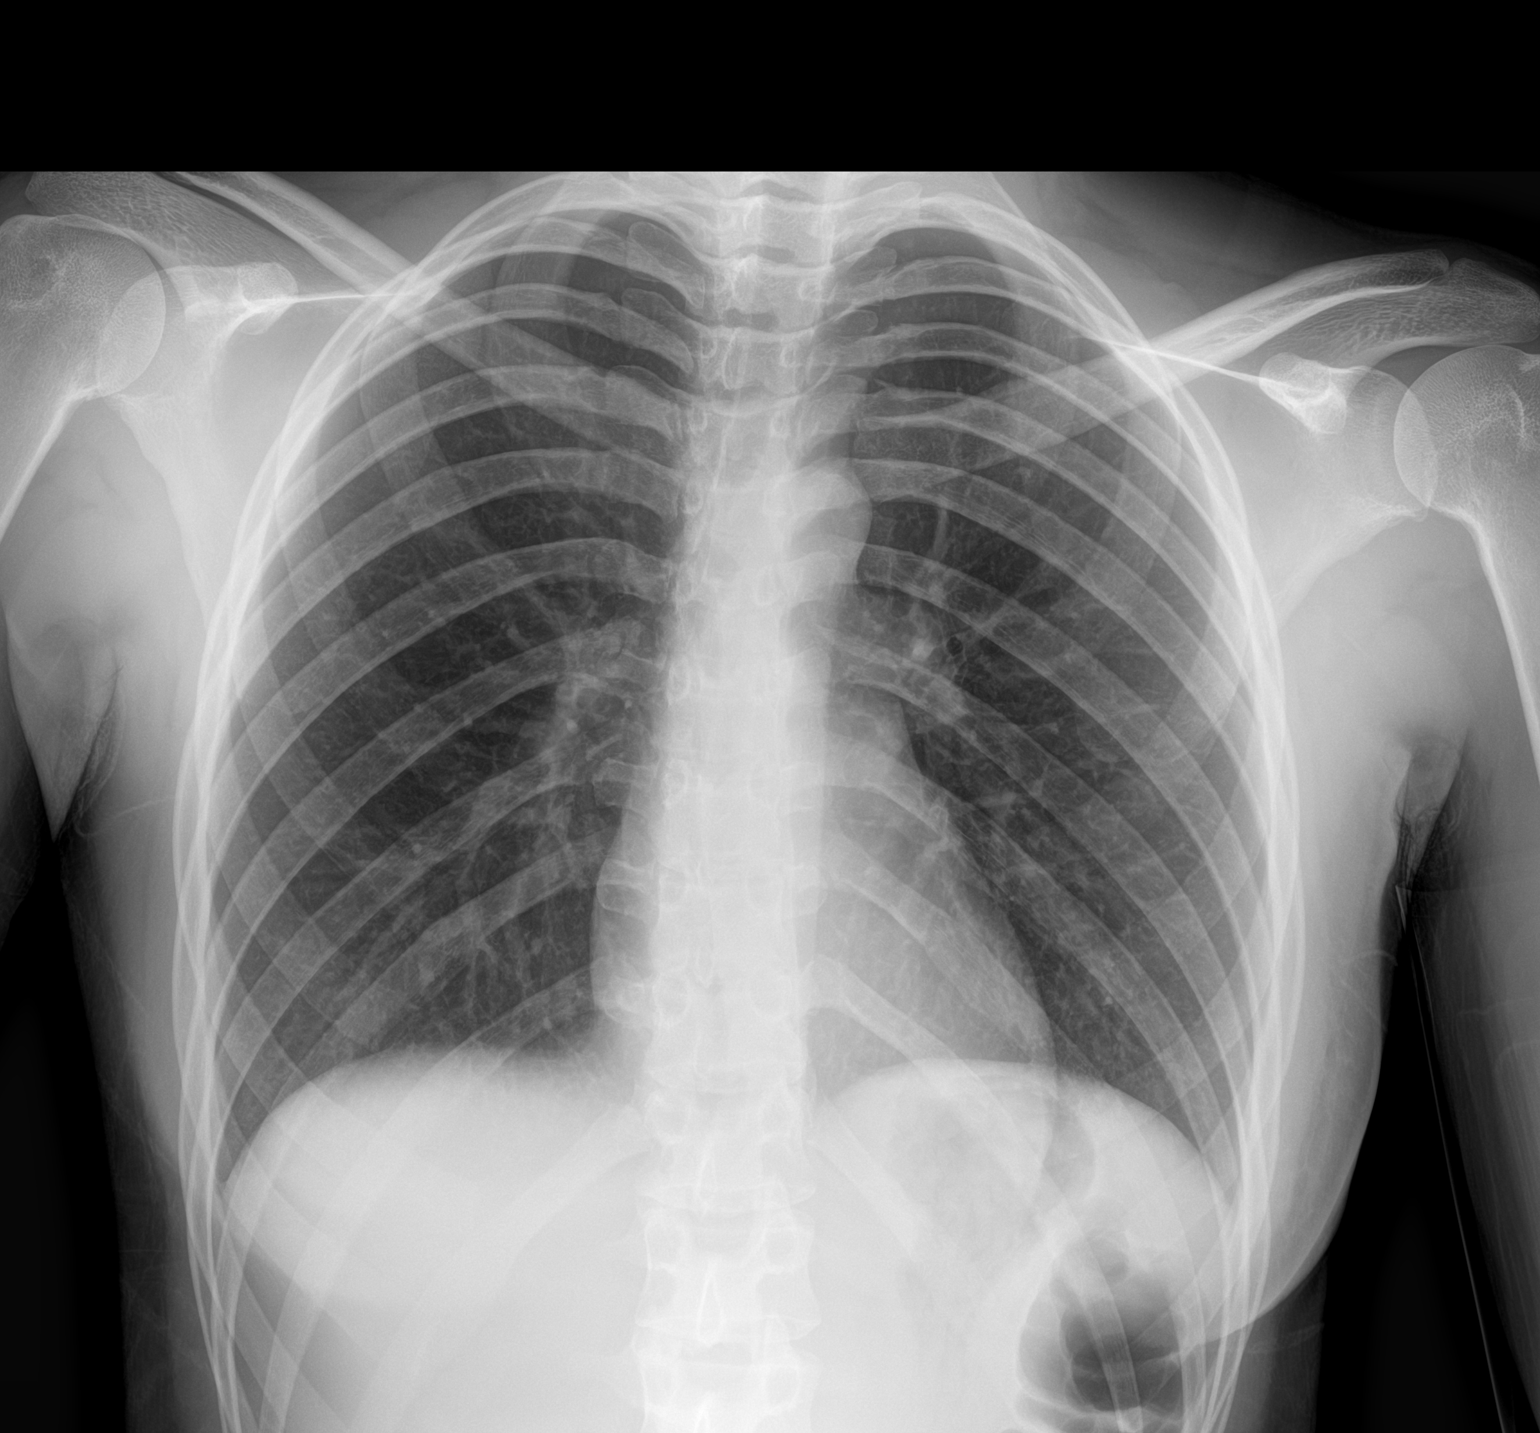

[1 of 1 positions shown; findings below may reference images not displayed]

FINDINGS: The heart size and mediastinal contours are within normal limits.
Both lungs are clear. The visualized skeletal structures are
unremarkable.
IMPRESSION: Normal chest x-ray.  No evidence of pneumonia.

## 2020-04-19 ENCOUNTER — Ambulatory Visit: Payer: Self-pay | Admitting: *Deleted

## 2020-04-19 NOTE — Telephone Encounter (Signed)
Pt evasive historian. Reports woke up last night with SOB. States awoke at [employer] a hotdog, laid down on couch and felt fine, later went to bed and woke up short of breath." States mild SOB presently "Kind of, off and on."  Denies CP, no chest pressure, no dizziness. Reports mild dry cough at times. No fever. Pt seen in ED 03/06/20 similar symptoms of SOB. Pt has appt 05/24/20. Advised UC/ED if SOB worsens, CP occurs. Assured NT would route to practice for PCPs review and final disposition, earlier appt if necessary. Reiterated need for ED eval if symptoms worsen. Pt verbalizes understanding.  Reason for Disposition . [1] MILD difficulty breathing (e.g., minimal/no SOB at rest, SOB with walking, pulse <100) AND [2] NEW-onset or WORSE than normal  Answer Assessment - Initial Assessment Questions 1. RESPIRATORY STATUS: "Describe your breathing?" (e.g., wheezing, shortness of breath, unable to speak, severe coughing)      SOB last night while lying down 2. ONSET: "When did this breathing problem begin?"      Last night 3. PATTERN "Does the difficult breathing come and go, or has it been constant since it started?"      Comes and goes 4. SEVERITY: "How bad is your breathing?" (e.g., mild, moderate, severe)    - MILD: No SOB at rest, mild SOB with walking, speaks normally in sentences, can lay down, no retractions, pulse < 100.    - MODERATE: SOB at rest, SOB with minimal exertion and prefers to sit, cannot lie down flat, speaks in phrases, mild retractions, audible wheezing, pulse 100-120.    - SEVERE: Very SOB at rest, speaks in single words, struggling to breathe, sitting hunched forward, retractions, pulse > 120     Mild 5. RECURRENT SYMPTOM: "Have you had difficulty breathing before?" If Yes, ask: "When was the last time?" and "What happened that time?"      no 6. CARDIAC HISTORY: "Do you have any history of heart disease?" (e.g., heart attack, angina, bypass surgery, angioplasty)       7. LUNG  HISTORY: "Do you have any history of lung disease?"  (e.g., pulmonary embolus, asthma, emphysema)     Asthma 8. CAUSE: "What do you think is causing the breathing problem?"      Unsure 9. OTHER SYMPTOMS: "Do you have any other symptoms? (e.g., dizziness, runny nose, cough, chest pain, fever)     Occasional dry cough , very mild 10. PREGNANCY: "Is there any chance you are pregnant?" "When was your last menstrual period?"       No.  Protocols used: BREATHING DIFFICULTY-A-AH

## 2020-04-20 NOTE — Telephone Encounter (Signed)
Contacted pt to answer her questions. Pt doesn't have any other questions or concerns

## 2020-04-21 ENCOUNTER — Telehealth: Payer: Self-pay | Admitting: Internal Medicine

## 2020-04-21 NOTE — Telephone Encounter (Signed)
I am not sure what that is and unable to give a recommendation. I can give recommendations regarding prescribed medications which are FDA approved. I would recommend scheduling an appointment with PCP to discuss this as I have no indications or diagnosis to order thyroid labs.

## 2020-04-21 NOTE — Telephone Encounter (Signed)
Will send to covering provider   Returned pt call. Pt states she purchased some supplements OTC and they are called Ashwagandha 600mg  L-Theanine 200mg  Anxiety and Stress Relief. Pt is wanting to know if this is okay for her to take also pt is requesting to come in for labs to have thyroid check.

## 2020-04-21 NOTE — Telephone Encounter (Signed)
Copied from CRM [id number]. Topic: General - Other >> Apr 20, 2020  3:46 PM Tamela Oddi wrote: Reason for CRM: Patient called to speak with the nurse regarding some supplements she got OTC.  She wanted to know if it ok for her to take them.  Please advise and call patient to discuss at [phone]

## 2020-04-22 NOTE — Telephone Encounter (Signed)
Returned Rachael Tanner call and went over Dr. Alvis Lemmings response Rachael Tanner states she understands Rachael Tanner asked if she can set up an appt I made Rachael Tanner aware that she already has an appt with provider on 4/19 and I advise her to keep that appt. Rachael Tanner states she understands and doesn't have any questions or concerns

## 2020-05-23 ENCOUNTER — Ambulatory Visit: Payer: Medicaid Other

## 2020-05-24 ENCOUNTER — Ambulatory Visit: Payer: Medicaid Other | Attending: Internal Medicine | Admitting: Internal Medicine

## 2020-05-24 ENCOUNTER — Ambulatory Visit: Payer: Medicaid Other

## 2020-05-24 ENCOUNTER — Other Ambulatory Visit: Payer: Self-pay

## 2020-05-24 DIAGNOSIS — R0781 Pleurodynia: Secondary | ICD-10-CM | POA: Diagnosis not present

## 2020-05-24 DIAGNOSIS — F411 Generalized anxiety disorder: Secondary | ICD-10-CM | POA: Diagnosis not present

## 2020-05-24 DIAGNOSIS — R0602 Shortness of breath: Secondary | ICD-10-CM | POA: Diagnosis not present

## 2020-05-24 MED ORDER — BUSPIRONE HCL 5 MG PO TABS
5.0000 mg | ORAL_TABLET | Freq: Two times a day (BID) | ORAL | 0 refills | Status: DC
  Filled 2020-05-24: qty 60, 30d supply, fill #0

## 2020-05-24 MED ORDER — ALBUTEROL SULFATE HFA 108 (90 BASE) MCG/ACT IN AERS
2.0000 | INHALATION_SPRAY | Freq: Four times a day (QID) | RESPIRATORY_TRACT | 2 refills | Status: DC | PRN
  Filled 2020-05-24: qty 8, 25d supply, fill #0

## 2020-05-24 NOTE — Progress Notes (Signed)
Cardiology Office Note:    Date:  05/26/2020   ID:  Rachael Tanner, DOB 04-11-2000, MRN [id number]  PCP:  Marcine Matar, MD  [facility] Cardiologist:  Riley Lam MD [facility] Electrophysiologist:  None   Referring MD: Marcine Matar, MD   CC: follow up PVCs  History of Present Illness:    Rachael Tanner is a 20 y.o. female with a hx of tobacco abuse, marijuana abuse, and anxiety who presented 11/25/19.  In interval had normal echo and stress test; with a heart monitor that showed occasional PVCs, last seen 02/18/20.  In interim of this visit, patient has had multiple evaluations for chest pain in the ED.  Patient notes that she is doing ok.  Since last visit notes fatigue on BB and stopped after one day.    Same chest pains as prior (positional non-exertional).  No SOB/DOE and no PND/Orthopnea.  No weight gain or leg swelling.  No palpitations or syncope .  Stopped smoking marijuna.  Still smokes black and mild's but is improving (2 a week).  Past Medical History:  Diagnosis Date  . Anxiety   . Anxiety   . COVID-19   . Marijuana use   . STD (female)   . Tobacco abuse    No past surgical history on file.  Current Medications: Current Meds  Medication Sig  . albuterol (VENTOLIN HFA) 108 (90 Base) MCG/ACT inhaler Inhale 2 puffs into the lungs every 6 (six) hours as needed for wheezing or shortness of breath.  . busPIRone (BUSPAR) 5 MG tablet Take 1 tablet (5 mg total) by mouth 2 (two) times daily.  . famotidine (PEPCID) 20 MG tablet Take 1 tablet (20 mg total) by mouth 2 (two) times daily.  . propranolol ER (INDERAL LA) 60 MG 24 hr capsule Take 1 capsule (60 mg total) by mouth daily.  Marland Kitchen tiZANidine (ZANAFLEX) 4 MG tablet Take 0.5-1 tablets (2-4 mg total) by mouth every 6 (six) hours as needed for muscle spasms.    Allergies:   Patient has no known allergies.   Social History   Socioeconomic History  . Marital status: Single    Spouse name: Not on  file  . Number of children: Not on file  . Years of education: Not on file  . Highest education level: Not on file  Occupational History  . Not on file  Tobacco Use  . Smoking status: Never Smoker  . Smokeless tobacco: Never Used  Vaping Use  . Vaping Use: Never used  Substance and Sexual Activity  . Alcohol use: Yes  . Drug use: Not Currently    Types: Marijuana  . Sexual activity: Not on file  Other Topics Concern  . Not on file  Social History Narrative  . Not on file   Social Determinants of Health   Financial Resource Strain: Not on file  Food Insecurity: Not on file  Transportation Needs: Not on file  Physical Activity: Not on file  Stress: Not on file  Social Connections: Not on file     Family History: The patient's family history includes Hypertension in her mother. No arrhythmia history  ROS:   Please see the history of present illness.    All other systems reviewed and are negative.  EKGs/Labs/Other Studies Reviewed:    The following studies were reviewed today:  EKG:   11/02/19- SR 92 WNL  Cardiac Event Monitoring: Date: 01/05/2020 Results:  Patient had a minimum heart rate of 60  bpm, maximum heart rate of 193 bpm, and average heart rate of 101 bpm.  Predominant underlying rhythm was sinus rhythm.  Isolated PACs were rare (<1.0%).  Isolated PVCs were occasional (2.3%), with rare couplets, bigeminy, and trigeminy present.  No evidence of complete heart block.  Triggered and diary events associated with sinus rhythm and isolated PVCs.   No malignant arrhythmias. Occasional PVCs, occasionally symptomatic.   Transthoracic Echocardiogram: Date: 12/22/2019 Results: 1. Left ventricular ejection fraction, by estimation, is 60 to 65%. The  left ventricle has normal function. The left ventricle has no regional  wall motion abnormalities. Left ventricular diastolic parameters were  normal.  2. Right ventricular systolic function is normal.  The right ventricular  size is normal. There is normal pulmonary artery systolic pressure. The  estimated right ventricular systolic pressure is 18.9 mmHg.  3. The mitral valve is normal in structure. No evidence of mitral valve  regurgitation. No evidence of mitral stenosis.  4. The aortic valve is normal in structure. Aortic valve regurgitation is  not visualized. No aortic stenosis is present.  5. The inferior vena cava is normal in size with greater than 50%  respiratory variability, suggesting right atrial pressure of 3 mmHg.   ECG Stress Testing: Date: 12/22/2019 Results:  Blood pressure demonstrated a normal response to exercise.  There was no ST segment deviation noted during stress.   ETT with normal exercise tolerance (9:00); no chest pain; normal blood pressure response; no ST changes; negative adequate exercise tolerance test; Duke treadmill score 9.  Recent Labs: 06/10/2019: TSH 2.389 03/06/2020: BUN 12; Creatinine, Ser 0.81; Potassium 3.8; Sodium 140 04/05/2020: Hemoglobin 14.0; Platelets 234   Physical Exam:    VS:  BP 112/78   Pulse 89   Ht 5\' 5"  (1.651 m)   Wt 171 lb (77.6 kg)   SpO2 98%   BMI 28.46 kg/m     Wt Readings from Last 3 Encounters:  05/26/20 171 lb (77.6 kg)  03/24/20 161 lb 3.2 oz (73.1 kg)  03/06/20 157 lb (71.2 kg)     GEN:  Well nourished, well developed in no acute distress HEENT: Normal NECK: No JVD; No carotid bruits LYMPHATICS: No lymphadenopathy CARDIAC: RRR, no murmurs, rubs, gallops RESPIRATORY:  Clear to auscultation without rales, wheezing or rhonchi  ABDOMEN: Soft, non-tender, non-distended MUSCULOSKELETAL:  No edema; No deformity  SKIN: Warm and dry NEUROLOGIC:  Alert and oriented x 3 PSYCHIATRIC:  Normal affect   ASSESSMENT:    1. PVC (premature ventricular contraction)   2. Smoker   3. Non-cardiac chest pain    PLAN:    In order of problems listed above:  Occasional PVCs Non-cardiac chest pain - Possible  symptomatic - had issues with therapy- will not plan on re-challenge with BB  Tobacco Abuse Marijuana Abuse  - discussed the dangers of tobacco use, both inhaled and oral, which include, but are not limited to cardiovascular disease, increased cancer risk of multiple types of cancer, COPD, peripheral arterial disease, strokes. - counseled on the benefits of smoking cessation. - firmly advised to quit.  - we also reviewed strategies to maximize success, including:  Removing cigarettes and smoking materials from environment   Stress management  Substitution of other forms of reinforcement (the one cigarette a day approach)  Support of family/friends and group smoking cessation  Selecting a quit date  Patient provided contact information for [facility] or 1-800-QUIT-NOW  Patient provided with [facility]'s 8 free smoking cessation classes: (33602/01/22 and ) [phone]  One year follow up unless new symptoms or abnormal test results warranting change in plan  Medication Adjustments/Labs and Tests Ordered: Current medicines are reviewed at length with the patient today.  Concerns regarding medicines are outlined above.  No orders of the defined types were placed in this encounter.  No orders of the defined types were placed in this encounter.   Patient Instructions  Medication Instructions:  Your physician recommends that you continue on your current medications as directed. Please refer to the Current Medication list given to you today.  *If you need a refill on your cardiac medications before your next appointment, please call your pharmacy*   Lab Work: NONE If you have labs (blood work) drawn today and your tests are completely normal, you will receive your results only by: Marland Kitchen MyChart Message (if you have MyChart) OR . A paper copy in the mail If you have any lab test that is abnormal or we need to change your treatment, we will call you to review the  results.   Testing/Procedures: NONE   Follow-Up: At [facility], you and your health needs are our priority.  As part of our continuing mission to provide you with exceptional heart care, we have created designated Provider Care Teams.  These Care Teams include your primary Cardiologist (physician) and Advanced Practice Providers (APPs -  Physician Assistants and Nurse Practitioners) who all work together to provide you with the care you need, when you need it.  We recommend signing up for the patient portal called "MyChart".  Sign up information is provided on this After Visit Summary.  MyChart is used to connect with patients for Virtual Visits (Telemedicine).  Patients are able to view lab/test results, encounter notes, upcoming appointments, etc.  Non-urgent messages can be sent to your provider as well.   To learn more about what you can do with MyChart, go to [email or web address].    Your next appointment:   12 month(s)  The format for your next appointment:   In Person  Provider:   You may see Riley Lam, MD or one of the following Advanced Practice Providers on your designated Care Team:    Ronie Spies, PA-C  Jacolyn Reedy, PA-C          Signed, Christell Constant, MD  05/26/2020 2:44 PM    [facility]

## 2020-05-24 NOTE — Progress Notes (Signed)
Virtual Visit via Telephone Note  I connected with Rachael Tanner on 05/24/2020 at 1:41 p.m by telephone and verified that I am speaking with the correct person using two identifiers  Location: Patient: home Provider: office  Participants: Myself Patient CMA: Ms. Julius Bowels    I discussed the limitations, risks, security and privacy concerns of performing an evaluation and management service by telephone and the availability of in person appointments. I also discussed with the patient that there may be a patient responsible charge related to this service. The patient expressed understanding and agreed to proceed.   History of Present Illness: Pt with hx of anxiety, recurrent CP, mastalgia, COVID infection 02/2019, tob dep.  Last evaluated 03/24/2020.   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 Occurred last wk.  It has since resolved.  It was worse when she laid down and better when she sat up. Some rib tenderness LT side close to breast.  Tender to touch. Chronic and intermittent. Intermittent SOB that "comes out of nowhere then I  feel fatigue."  Can last 1-2 hrs.  Symptoms present since January of this year.  Had asthma as a child and use to be on inhalers.  Notice slight wheezing when she gets SOB.  Mild cough at nights.  No recent allergy symptoms Not on any BC pills or shots.  Not sure of any swelling in the legs.  No recent long distance travel.   -She was worked up by cardiology for palpitations. Placed on Propranolol for occasional sometimes symptomatic PVCs.  She took it a few times then stopped because it made her feel drowsy.  Still gets palpitations sometimes.   Patient with anxiety.  We have tried putting her on medication for this in the past but she did not take it. Outpatient Encounter Medications as of 05/24/2020  Medication Sig  . famotidine (PEPCID) 20 MG tablet  Take 1 tablet (20 mg total) by mouth 2 (two) times daily. (Patient not taking: Reported on 03/24/2020)  . propranolol ER (INDERAL LA) 60 MG 24 hr capsule Take 1 capsule (60 mg total) by mouth daily. (Patient not taking: Reported on 03/24/2020)  . tiZANidine (ZANAFLEX) 4 MG tablet Take 0.5-1 tablets (2-4 mg total) by mouth every 6 (six) hours as needed for muscle spasms. (Patient not taking: Reported on 03/24/2020)   No facility-administered encounter medications on file as of 05/24/2020.      Observations/Objective: No direct observation done as this was a telephone encounter.  Assessment and Plan: 1. Shortness of breath I think anxiety plays a role in her symptoms. However given history of childhood asthma and reports of some wheezing, I recommend trying her with albuterol inhaler.  I also recommend coming to the lab for [place] to check a D-dimer. - albuterol (VENTOLIN HFA) 108 (90 Base) MCG/ACT inhaler; Inhale 2 puffs into the lungs every 6 (six) hours as needed for wheezing or shortness of breath.  Dispense: 8 g; Refill: 2 - D-dimer, quantitative; Future  2. Rib pain Recommend using ibuprofen plus Tylenol in combination as needed.  3. Generalized anxiety disorder Recommend giving a trial of medication for anxiety.  She is willing to do so.  I will put her on a very low-dose of BuSpar. - busPIRone (BUSPAR) 5 MG tablet; Take 1 tablet (5 mg total) by mouth 2 (two) times daily.  Dispense: 60 tablet; Refill:  0   Follow Up Instructions:    I discussed the assessment and treatment plan with the patient. The patient was provided an opportunity to ask questions and all were answered. The patient agreed with the plan and demonstrated an understanding of the instructions.   The patient was advised to call back or seek an in-person evaluation if the symptoms worsen or if the condition fails to improve as anticipated.  I  Spent 20 minutes on this telephone encounter  Jonah Blue, MD

## 2020-05-24 NOTE — Progress Notes (Signed)
Pt will be coming by tomorrow to get a ppd

## 2020-05-25 ENCOUNTER — Other Ambulatory Visit: Payer: Self-pay

## 2020-05-25 ENCOUNTER — Ambulatory Visit: Payer: Medicaid Other | Attending: Internal Medicine

## 2020-05-25 DIAGNOSIS — R0602 Shortness of breath: Secondary | ICD-10-CM | POA: Diagnosis not present

## 2020-05-25 DIAGNOSIS — Z111 Encounter for screening for respiratory tuberculosis: Secondary | ICD-10-CM

## 2020-05-25 NOTE — Addendum Note (Signed)
Addended byMemory Dance on: 05/25/2020 03:46 PM   Modules accepted: Orders

## 2020-05-26 ENCOUNTER — Encounter: Payer: Self-pay | Admitting: Internal Medicine

## 2020-05-26 ENCOUNTER — Ambulatory Visit (INDEPENDENT_AMBULATORY_CARE_PROVIDER_SITE_OTHER): Payer: Medicaid Other | Admitting: Internal Medicine

## 2020-05-26 VITALS — BP 112/78 | HR 89 | Ht 65.0 in | Wt 171.0 lb

## 2020-05-26 DIAGNOSIS — R0789 Other chest pain: Secondary | ICD-10-CM

## 2020-05-26 DIAGNOSIS — F17200 Nicotine dependence, unspecified, uncomplicated: Secondary | ICD-10-CM | POA: Diagnosis not present

## 2020-05-26 DIAGNOSIS — I493 Ventricular premature depolarization: Secondary | ICD-10-CM | POA: Diagnosis not present

## 2020-05-26 LAB — D-DIMER, QUANTITATIVE: D-DIMER: 0.26 mg/L FEU (ref 0.00–0.49)

## 2020-05-26 NOTE — Patient Instructions (Signed)
Medication Instructions:  Your physician recommends that you continue on your current medications as directed. Please refer to the Current Medication list given to you today.  *If you need a refill on your cardiac medications before your next appointment, please call your pharmacy*   Lab Work: NONE If you have labs (blood work) drawn today and your tests are completely normal, you will receive your results only by: . MyChart Message (if you have MyChart) OR . A paper copy in the mail If you have any lab test that is abnormal or we need to change your treatment, we will call you to review the results.   Testing/Procedures: NONE   Follow-Up: At [facility], you and your health needs are our priority.  As part of our continuing mission to provide you with exceptional heart care, we have created designated Provider Care Teams.  These Care Teams include your primary Cardiologist (physician) and Advanced Practice Providers (APPs -  Physician Assistants and Nurse Practitioners) who all work together to provide you with the care you need, when you need it.  We recommend signing up for the patient portal called "MyChart".  Sign up information is provided on this After Visit Summary.  MyChart is used to connect with patients for Virtual Visits (Telemedicine).  Patients are able to view lab/test results, encounter notes, upcoming appointments, etc.  Non-urgent messages can be sent to your provider as well.   To learn more about what you can do with MyChart, go to [email or web address].    Your next appointment:   12 month(s)  The format for your next appointment:   In Person  Provider:   You may see Mahesh Chandrasekhar, MD or one of the following Advanced Practice Providers on your designated Care Team:    Dayna Dunn, PA-C  Michele Lenze, PA-C        

## 2020-05-26 NOTE — Progress Notes (Signed)
Tuberculin skin test applied to left ventral forearm.  Patient informed to schedule appt for nurse visit in 48-72 hours to have site read.  Jay'A R Louvinia Cumbo, RMA  

## 2020-05-27 LAB — TB SKIN TEST
Induration: 0 mm
TB Skin Test: NEGATIVE

## 2020-06-06 ENCOUNTER — Telehealth: Payer: Self-pay | Admitting: Internal Medicine

## 2020-06-06 NOTE — Telephone Encounter (Signed)
Pt is calling let Dr. Laural Benes nurse know that the paperwork section regarding vaccines is yes or no. And please let her know when it is available for pick up. CB- [phone]

## 2020-06-07 NOTE — Telephone Encounter (Signed)
Returned pt call and made it aware that we know it is a yes or no question but we are trying to figure out if she is wanting the vaccine pt declines. Made pt aware that I will give provider the form and we call her once it is ready for pick-up

## 2020-06-22 NOTE — Telephone Encounter (Signed)
Pt is wanting instead of picking up form to fax it to the # that is on the form as deadline is 5/19.

## 2020-06-24 NOTE — Telephone Encounter (Signed)
Pt is calling and the form needs to be fax to both number [phone] and the number on the form also please fax today

## 2020-06-24 NOTE — Telephone Encounter (Signed)
Will fax form 

## 2020-08-15 ENCOUNTER — Ambulatory Visit
Admission: EM | Admit: 2020-08-15 | Discharge: 2020-08-15 | Disposition: A | Discharge status: Home / Self Care | Payer: Medicaid Other | Attending: Family Medicine | Admitting: Family Medicine

## 2020-08-15 ENCOUNTER — Other Ambulatory Visit: Payer: Self-pay

## 2020-08-15 ENCOUNTER — Encounter: Payer: Self-pay | Admitting: *Deleted

## 2020-08-15 DIAGNOSIS — R140 Abdominal distension (gaseous): Secondary | ICD-10-CM

## 2020-08-15 DIAGNOSIS — R102 Pelvic and perineal pain: Secondary | ICD-10-CM | POA: Insufficient documentation

## 2020-08-15 DIAGNOSIS — N898 Other specified noninflammatory disorders of vagina: Secondary | ICD-10-CM | POA: Diagnosis not present

## 2020-08-15 HISTORY — DX: Palpitations: R00.2

## 2020-08-15 HISTORY — DX: Unspecified asthma, uncomplicated: J45.909

## 2020-08-15 LAB — POCT URINALYSIS DIP (MANUAL ENTRY)
Bilirubin, UA: NEGATIVE
Blood, UA: NEGATIVE
Glucose, UA: NEGATIVE mg/dL
Ketones, POC UA: NEGATIVE mg/dL
Leukocytes, UA: NEGATIVE
Nitrite, UA: NEGATIVE
Protein Ur, POC: NEGATIVE mg/dL
Spec Grav, UA: >=1.03 — AB (ref 1.010–1.025)
Urobilinogen, UA: 0.2 E.U./dL
pH, UA: 6 (ref 5.0–8.0)

## 2020-08-15 LAB — POCT URINE PREGNANCY: Preg Test, Ur: NEGATIVE

## 2020-08-15 MED ORDER — SIMETHICONE 125 MG PO CHEW: 125.0000 mg | CHEWABLE_TABLET | Freq: Four times a day (QID) | ORAL | 0 refills | Status: DC | PRN

## 2020-08-15 NOTE — ED Triage Notes (Signed)
C/O intermittent low abd cramping that awakens pt during the night, along with flatulence over past 2 days.  Also c/o fluttering feeling in abd at times.  Denies n/v/d.  C/O vaginal discharge episodes few days ago.

## 2020-08-15 NOTE — ED Provider Notes (Signed)
[name]    CSN: [id number] Arrival date & time: 08/15/20  1209      History   Chief Complaint Chief Complaint  Patient presents with   Abdominal Pain   Flatulence   Vaginal Discharge    HPI Rachael Tanner is a 20 y.o. female.   Patient presenting today with 2-day history of bilateral lower abdominal cramping intermittently, excessive flatulence, bloating and flutters in upper abdomen.  Also having some vaginal discharge that started several days ago.  Notes her periods have been very irregular since getting off birth control about a year ago, just had one of her first cycles of since getting off the birth control at the end of last month and had large clots initially with that.  Denies fever, chills, nausea vomiting or diarrhea, constipation, heavy vaginal bleeding, concern for STDs.  No other recent changes to medications, daily routine, supplements, diet.  Not trying anything over-the-counter for symptoms thus far.  Past Medical History:  Diagnosis Date   Anxiety    Anxiety    Asthma    COVID-19    Marijuana use    Palpitations    STD (female)    Tobacco abuse    Patient Active Problem List   Diagnosis Date Noted   Polycythemia 03/29/2020   Smoker 03/29/2020   PVC (premature ventricular contraction) 02/18/2020   Non-cardiac chest pain 11/25/2019   Palpitations 11/25/2019   Shortness of breath 11/25/2019   History reviewed. No pertinent surgical history.  OB History   No obstetric history on file.      Home Medications    Prior to Admission medications   Medication Sig Start Date End Date Taking? Authorizing Provider  simethicone (MYLICON) 125 MG chewable tablet Chew 1 tablet (125 mg total) by mouth every 6 (six) hours as needed for flatulence. 08/15/20  Yes Particia Nearing, PA-C  albuterol (VENTOLIN HFA) 108 (90 Base) MCG/ACT inhaler Inhale 2 puffs into the lungs every 6 (six) hours as needed for wheezing or shortness of breath.  05/24/20   Marcine Matar, MD  busPIRone (BUSPAR) 5 MG tablet Take 1 tablet (5 mg total) by mouth 2 (two) times daily. 05/24/20   Marcine Matar, MD  famotidine (PEPCID) 20 MG tablet Take 1 tablet (20 mg total) by mouth 2 (two) times daily. 02/27/20   Wieters, Hallie C, PA-C  propranolol ER (INDERAL LA) 60 MG 24 hr capsule Take 1 capsule (60 mg total) by mouth daily. 02/18/20   Chandrasekhar, Rondel Jumbo, MD  tiZANidine (ZANAFLEX) 4 MG tablet Take 0.5-1 tablets (2-4 mg total) by mouth every 6 (six) hours as needed for muscle spasms. 02/27/20   Wieters, Junius Creamer, PA-C    Family History Family History  Problem Relation Age of Onset   Hypertension Mother     Social History Social History   Tobacco Use   Smoking status: Some Days    Pack years: 0.00    Types: Cigars   Smokeless tobacco: Never  Vaping Use   Vaping Use: Never used  Substance Use Topics   Alcohol use: Yes    Comment: "weekends"     Allergies   Patient has no known allergies.   Review of Systems Review of Systems Per HPI  Physical Exam Triage Vital Signs ED Triage Vitals  Enc Vitals Group     BP 08/15/20 1358 117/75     Pulse Rate 08/15/20 1358 97     Resp 08/15/20 1358 16  Temp 08/15/20 1358 98.2 F (36.8 C)     Temp Source 08/15/20 1358 Temporal     SpO2 08/15/20 1358 98 %     Weight --      Height --      Head Circumference --      Peak Flow --      Pain Score 08/15/20 1359 0     Pain Loc --      Pain Edu? --      Excl. in GC? --    No data found.  Updated Vital Signs BP 117/75   Pulse 97   Temp 98.2 F (36.8 C) (Temporal)   Resp 16   LMP 08/01/2020 (Exact Date) Comment: "first period in a year since stopping Depo"  SpO2 98%   Visual Acuity Right Eye Distance:   Left Eye Distance:   Bilateral Distance:    Right Eye Near:   Left Eye Near:    Bilateral Near:     Physical Exam Vitals and nursing note reviewed.  Constitutional:      Appearance: Normal appearance. She is not  ill-appearing.  HENT:     Head: Atraumatic.     Mouth/Throat:     Mouth: Mucous membranes are moist.     Pharynx: Oropharynx is clear.  Eyes:     Extraocular Movements: Extraocular movements intact.     Conjunctiva/sclera: Conjunctivae normal.  Cardiovascular:     Rate and Rhythm: Normal rate and regular rhythm.     Heart sounds: Normal heart sounds.  Pulmonary:     Effort: Pulmonary effort is normal.     Breath sounds: Normal breath sounds.  Abdominal:     General: Bowel sounds are normal. There is no distension.     Palpations: Abdomen is soft.     Tenderness: There is no abdominal tenderness. There is no right CVA tenderness, left CVA tenderness or guarding.  Genitourinary:    Comments: GU exam deferred, self swab performed Musculoskeletal:        General: Normal range of motion.     Cervical back: Normal range of motion and neck supple.  Skin:    General: Skin is warm and dry.  Neurological:     Mental Status: She is alert and oriented to person, place, and time.  Psychiatric:        Mood and Affect: Mood normal.        Thought Content: Thought content normal.        Judgment: Judgment normal.     UC Treatments / Results  Labs (all labs ordered are listed, but only abnormal results are displayed) Labs Reviewed  POCT URINALYSIS DIP (MANUAL ENTRY) - Abnormal; Notable for the following components:      Result Value   Spec Grav, UA >=1.030 (*)    All other components within normal limits  POCT URINE PREGNANCY  CERVICOVAGINAL ANCILLARY ONLY    EKG   Radiology No results found.  Procedures Procedures (including critical care time)  Medications Ordered in UC Medications - No data to display  Initial Impression / Assessment and Plan / UC Course  I have reviewed the triage vital signs and the nursing notes.  Pertinent labs & imaging results that were available during my care of the patient were reviewed by me and considered in my medical decision making (see  chart for details).     Vitals and exam benign and reassuring.  Urine pregnancy negative, UA without evidence of urinary tract infection or  other acute abnormality.  Vaginal swab pending.  Will start simethicone for as needed use for her gas and bloating, add fiber supplement to see if this helps additionally.  Treat based on vaginal swab if positive for infectious etiology.  Follow-up for worsening or unresolving symptoms.  Final Clinical Impressions(s) / UC Diagnoses   Final diagnoses:  Suprapubic abdominal pain  Vaginal discharge  Bloating   Discharge Instructions   None    ED Prescriptions     Medication Sig Dispense Auth. Provider   simethicone (MYLICON) 125 MG chewable tablet Chew 1 tablet (125 mg total) by mouth every 6 (six) hours as needed for flatulence. 30 tablet Particia Nearing, [place]      PDMP not reviewed this encounter.   Particia Nearing, [place] 08/15/20 [phone]

## 2020-08-17 LAB — CERVICOVAGINAL ANCILLARY ONLY
Bacterial Vaginitis (gardnerella): NEGATIVE
Candida Glabrata: POSITIVE — AB
Candida Vaginitis: NEGATIVE
Chlamydia: NEGATIVE
Comment: NEGATIVE
Comment: NEGATIVE
Comment: NEGATIVE
Comment: NEGATIVE
Comment: NEGATIVE
Comment: NORMAL
Neisseria Gonorrhea: NEGATIVE
Trichomonas: NEGATIVE

## 2020-08-18 ENCOUNTER — Telehealth (HOSPITAL_COMMUNITY): Payer: Self-pay | Admitting: Emergency Medicine

## 2020-08-18 MED ORDER — FLUCONAZOLE 150 MG PO TABS: 150.0000 mg | ORAL_TABLET | Freq: Once | ORAL | 0 refills | Status: AC

## 2020-09-10 DIAGNOSIS — Z20822 Contact with and (suspected) exposure to covid-19: Secondary | ICD-10-CM | POA: Diagnosis not present

## 2020-09-19 ENCOUNTER — Ambulatory Visit: Payer: Self-pay

## 2020-09-19 NOTE — Telephone Encounter (Signed)
Pt. Had unprotected sex 2 days ago. Requesting Plan B be sent to her pharmacy today. Can not afford to pay for it OTC. No answer in the practice. Please advise pt.   Reason for Disposition  [1] Unprotected sexual intercourse AND [2] within past 72 to 120 hours (3 to 5 days)  Answer Assessment - Initial Assessment Questions 1. REASON: "What is the reason you want emergency contraception ("morning after pill")?"     Unprotected sex 2. WHEN - SEXUAL INTERCOURSE: "When did your last sexual intercourse occur?"     2 days ago 3. CONTRACEPTION: "What type of contraception have you been using?" (e.g., none; condoms, BCP, IUD)     None 4. OTHER SYMPTOMS: "Do you have any other symptoms?"     No 5. PREGNANCY: "Is there any chance you were already pregnant?" "When was your last menstrual period?"     June 27  Protocols used: Contraception - [facility]

## 2020-09-22 NOTE — Telephone Encounter (Signed)
Tried contacting pt pt vm not set up will send MyChart message

## 2020-10-04 DIAGNOSIS — H0102A Squamous blepharitis right eye, upper and lower eyelids: Secondary | ICD-10-CM | POA: Diagnosis not present

## 2020-10-04 DIAGNOSIS — H0288B Meibomian gland dysfunction left eye, upper and lower eyelids: Secondary | ICD-10-CM | POA: Diagnosis not present

## 2020-10-04 DIAGNOSIS — H52223 Regular astigmatism, bilateral: Secondary | ICD-10-CM | POA: Diagnosis not present

## 2020-10-04 DIAGNOSIS — H1045 Other chronic allergic conjunctivitis: Secondary | ICD-10-CM | POA: Diagnosis not present

## 2020-10-04 DIAGNOSIS — H0102B Squamous blepharitis left eye, upper and lower eyelids: Secondary | ICD-10-CM | POA: Diagnosis not present

## 2020-10-04 DIAGNOSIS — H0288A Meibomian gland dysfunction right eye, upper and lower eyelids: Secondary | ICD-10-CM | POA: Diagnosis not present

## 2020-10-04 DIAGNOSIS — H5213 Myopia, bilateral: Secondary | ICD-10-CM | POA: Diagnosis not present

## 2020-10-17 DIAGNOSIS — Z20822 Contact with and (suspected) exposure to covid-19: Secondary | ICD-10-CM | POA: Diagnosis not present

## 2020-10-19 DIAGNOSIS — J329 Chronic sinusitis, unspecified: Secondary | ICD-10-CM | POA: Diagnosis not present

## 2020-10-19 DIAGNOSIS — R059 Cough, unspecified: Secondary | ICD-10-CM | POA: Diagnosis not present

## 2020-10-24 DIAGNOSIS — R072 Precordial pain: Secondary | ICD-10-CM | POA: Diagnosis not present

## 2020-10-24 DIAGNOSIS — R0789 Other chest pain: Secondary | ICD-10-CM | POA: Diagnosis not present

## 2020-11-18 IMAGING — CR DG CHEST 2V
2 series · 2 of 2 positions shown · non-contrast
Comparison: 09/24/2019

CLINICAL DATA: Left upper quadrant chest pain.  Nonsmoker.

EXAM:
CHEST - 2 VIEW

[w chest pa]
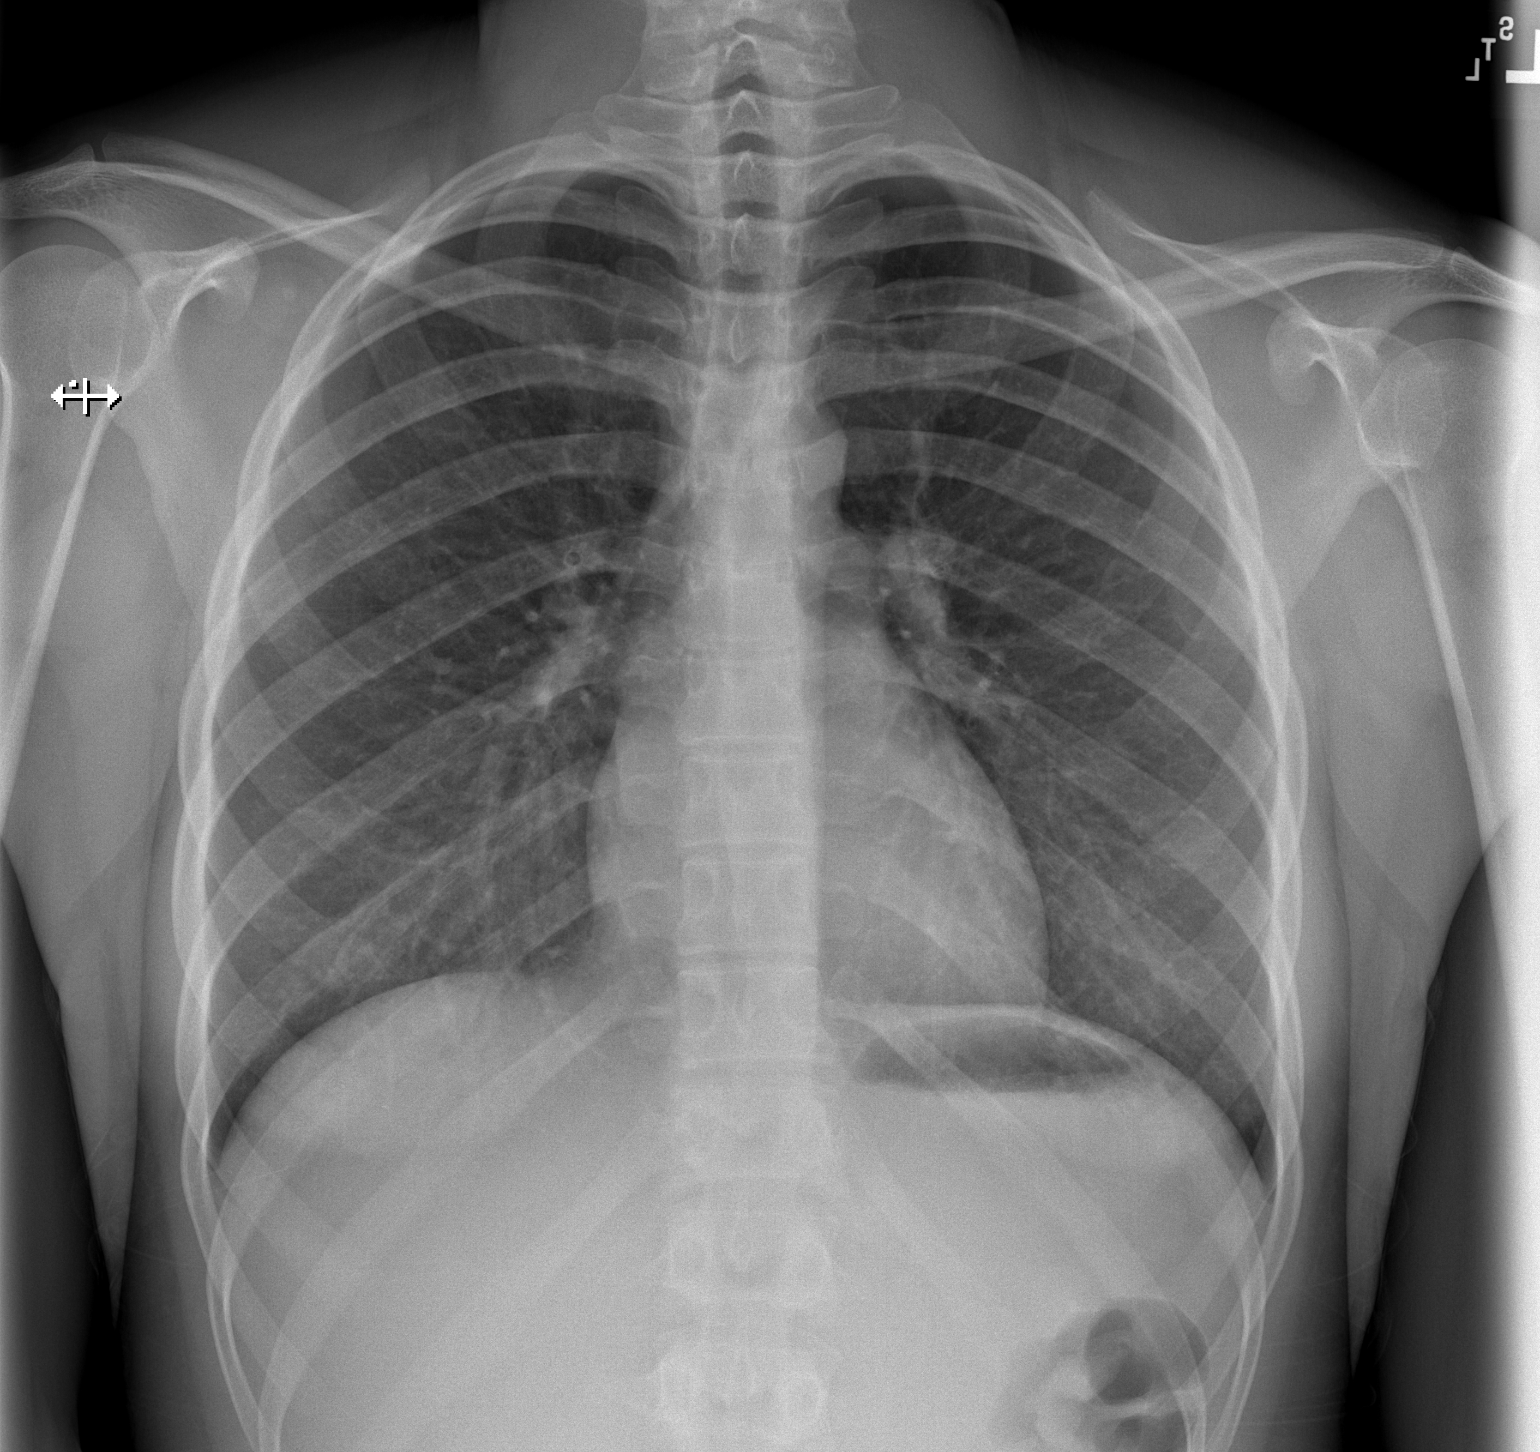

[w chest lat]
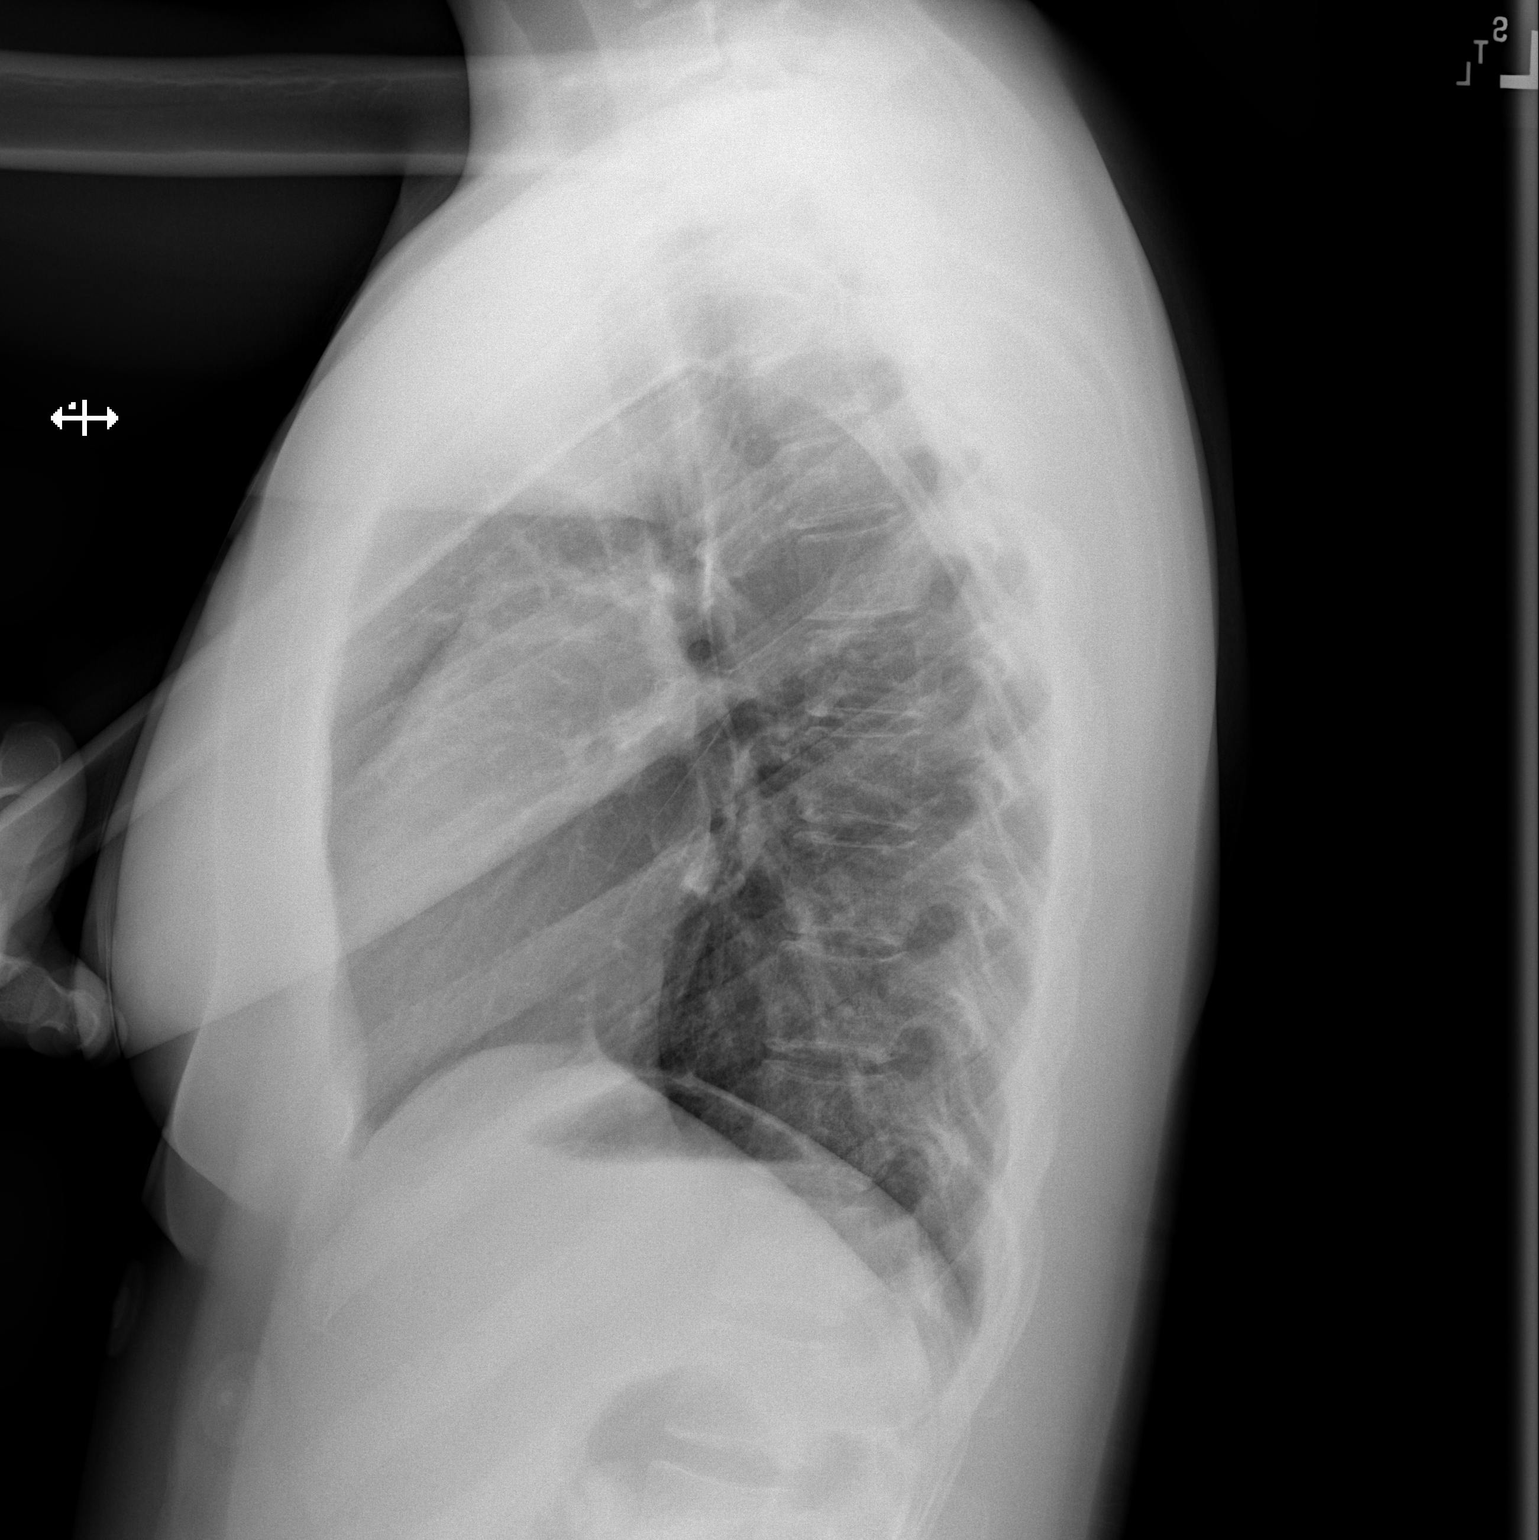

[2 of 2 positions shown; findings below may reference images not displayed]

FINDINGS: The heart size and mediastinal contours are within normal limits.
Both lungs are clear. The visualized skeletal structures are
unremarkable.
IMPRESSION: No active cardiopulmonary disease.

## 2020-11-30 ENCOUNTER — Other Ambulatory Visit: Payer: Self-pay

## 2020-11-30 ENCOUNTER — Ambulatory Visit
Admission: EM | Admit: 2020-11-30 | Discharge: 2020-11-30 | Disposition: A | Discharge status: Home / Self Care | Payer: Medicaid Other

## 2020-11-30 ENCOUNTER — Encounter: Payer: Self-pay | Admitting: Emergency Medicine

## 2020-11-30 DIAGNOSIS — J101 Influenza due to other identified influenza virus with other respiratory manifestations: Secondary | ICD-10-CM

## 2020-11-30 LAB — POCT INFLUENZA A/B
Influenza A, POC: POSITIVE — AB
Influenza B, POC: NEGATIVE

## 2020-11-30 MED ORDER — OSELTAMIVIR PHOSPHATE 75 MG PO CAPS
75.0000 mg | ORAL_CAPSULE | Freq: Two times a day (BID) | ORAL | 0 refills | Status: AC
Start: 2020-11-30 — End: 2020-12-05

## 2020-11-30 MED ORDER — PREDNISONE 10 MG PO TABS: 20.0000 mg | ORAL_TABLET | Freq: Every day | ORAL | 0 refills | Status: AC

## 2020-11-30 NOTE — ED Provider Notes (Signed)
[name]    CSN: [id number] Arrival date & time: 11/30/20  0820      History   Chief Complaint Chief Complaint  Patient presents with  . Cough    HPI Rachael Tanner is a 20 y.o. female.   Patient presents with 2-day history of nonproductive cough, headache, body aches, chills, episodes of diaphoresis.  Denies any known fevers.  Denies chest pain but does endorse mild, intermittent shortness of breath.  Patient attributes this to the mask that she is currently wearing.  Does have history of asthma.  Patient has been recently exposed to someone who tested positive for the flu.  She has taken Tylenol Cold and flu with minimal improvement in symptoms.   Cough  Past Medical History:  Diagnosis Date  . Anxiety   . Anxiety   . Asthma   . COVID-19   . Marijuana use   . Palpitations   . STD (female)   . Tobacco abuse     Patient Active Problem List   Diagnosis Date Noted  . Polycythemia 03/29/2020  . Smoker 03/29/2020  . PVC (premature ventricular contraction) 02/18/2020  . Non-cardiac chest pain 11/25/2019  . Palpitations 11/25/2019  . Shortness of breath 11/25/2019    History reviewed. No pertinent surgical history.  OB History   No obstetric history on file.      Home Medications    Prior to Admission medications   Medication Sig Start Date End Date Taking? Authorizing Provider  oseltamivir (TAMIFLU) 75 MG capsule Take 1 capsule (75 mg total) by mouth every 12 (twelve) hours for 5 days. 11/30/20 12/05/20 Yes Shekinah Pitones, Acie Fredrickson, FNP  Phenyleph-CPM-DM-APAP (TYLENOL CHILDRENS COLD/FLU PO) Take by mouth.   Yes [provider]  predniSONE (DELTASONE) 10 MG tablet Take 2 tablets (20 mg total) by mouth daily for 5 days. 11/30/20 12/05/20 Yes Rivaan Kendall, Acie Fredrickson, FNP  albuterol (VENTOLIN HFA) 108 (90 Base) MCG/ACT inhaler Inhale 2 puffs into the lungs every 6 (six) hours as needed for wheezing or shortness of breath. Patient not taking: Reported on  11/30/2020 05/24/20   Marcine Matar, MD  busPIRone (BUSPAR) 5 MG tablet Take 1 tablet (5 mg total) by mouth 2 (two) times daily. Patient not taking: Reported on 11/30/2020 05/24/20   Marcine Matar, MD  famotidine (PEPCID) 20 MG tablet Take 1 tablet (20 mg total) by mouth 2 (two) times daily. Patient not taking: Reported on 11/30/2020 02/27/20   Wieters, Fran Lowes C, PA-C  propranolol ER (INDERAL LA) 60 MG 24 hr capsule Take 1 capsule (60 mg total) by mouth daily. Patient not taking: Reported on 11/30/2020 02/18/20   Christell Constant, MD  simethicone (MYLICON) 125 MG chewable tablet Chew 1 tablet (125 mg total) by mouth every 6 (six) hours as needed for flatulence. Patient not taking: Reported on 11/30/2020 08/15/20   Particia Nearing, PA-C  tiZANidine (ZANAFLEX) 4 MG tablet Take 0.5-1 tablets (2-4 mg total) by mouth every 6 (six) hours as needed for muscle spasms. Patient not taking: Reported on 11/30/2020 02/27/20   Lew Dawes, PA-C    Family History Family History  Problem Relation Age of Onset  . Hypertension Mother     Social History Social History   Tobacco Use  . Smoking status: Some Days    Types: Cigars  . Smokeless tobacco: Never  Vaping Use  . Vaping Use: Never used  Substance Use Topics  . Alcohol use: Yes    Comment: "weekends"  .  Drug use: Not Currently    Types: Marijuana     Allergies   Patient has no known allergies.   Review of Systems Review of Systems Per HPI  Physical Exam Triage Vital Signs ED Triage Vitals  Enc Vitals Group     BP 11/30/20 0934 111/68     Pulse Rate 11/30/20 0934 (!) 121     Resp 11/30/20 0934 20     Temp 11/30/20 0934 99.7 F (37.6 C)     Temp Source 11/30/20 0934 Oral     SpO2 11/30/20 0934 93 %     Weight --      Height --      Head Circumference --      Peak Flow --      Pain Score 11/30/20 0930 6     Pain Loc --      Pain Edu? --      Excl. in GC? --    No data found.  Updated Vital  Signs BP 111/68 (BP Location: Left Arm)   Pulse (!) 121   Temp 99.7 F (37.6 C) (Oral)   Resp 20   LMP 11/05/2020   SpO2 93%   Visual Acuity Right Eye Distance:   Left Eye Distance:   Bilateral Distance:    Right Eye Near:   Left Eye Near:    Bilateral Near:     Physical Exam Constitutional:      General: She is not in acute distress.    Appearance: Normal appearance.  HENT:     Head: Normocephalic and atraumatic.     Right Ear: Ear canal normal. A middle ear effusion is present. Tympanic membrane is not perforated, erythematous or bulging.     Left Ear: Ear canal normal. A middle ear effusion is present. Tympanic membrane is not perforated, erythematous or bulging.     Nose: Congestion present.     Mouth/Throat:     Mouth: Mucous membranes are moist.     Pharynx: No posterior oropharyngeal erythema.  Eyes:     Extraocular Movements: Extraocular movements intact.     Conjunctiva/sclera: Conjunctivae normal.     Pupils: Pupils are equal, round, and reactive to light.  Cardiovascular:     Rate and Rhythm: Normal rate and regular rhythm.     Pulses: Normal pulses.     Heart sounds: Normal heart sounds.  Pulmonary:     Effort: Pulmonary effort is normal. No respiratory distress.     Breath sounds: Normal breath sounds. No wheezing.  Abdominal:     General: Abdomen is flat. Bowel sounds are normal.     Palpations: Abdomen is soft.  Musculoskeletal:        General: Normal range of motion.     Cervical back: Normal range of motion.  Skin:    General: Skin is warm and dry.  Neurological:     General: No focal deficit present.     Mental Status: She is alert and oriented to person, place, and time. Mental status is at baseline.  Psychiatric:        Mood and Affect: Mood normal.        Behavior: Behavior normal.     UC Treatments / Results  Labs (all labs ordered are listed, but only abnormal results are displayed) Labs Reviewed  POCT INFLUENZA A/B - Abnormal;  Notable for the following components:      Result Value   Influenza A, POC Positive (*)    All other  components within normal limits    EKG   Radiology No results found.  Procedures Procedures (including critical care time)  Medications Ordered in UC Medications - No data to display  Initial Impression / Assessment and Plan / UC Course  I have reviewed the triage vital signs and the nursing notes.  Pertinent labs & imaging results that were available during my care of the patient were reviewed by me and considered in my medical decision making (see chart for details).     Tamiflu x5 days for influenza A.  Will prescribe prednisone low-dose x5 days to help alleviate shortness of breath and symptoms.  No red flags seen on exam.  No signs of respiratory distress.Discussed strict return precautions. Patient verbalized understanding and is agreeable with plan.  Final Clinical Impressions(s) / UC Diagnoses   Final diagnoses:  Influenza A     Discharge Instructions      You have tested positive for influenza A.  You have been prescribed Tamiflu to treat this.  You have also been prescribed prednisone steroid to help with shortness of breath.  Please go the hospital if symptoms significantly worsen.     ED Prescriptions     Medication Sig Dispense Auth. Provider   oseltamivir (TAMIFLU) 75 MG capsule Take 1 capsule (75 mg total) by mouth every 12 (twelve) hours for 5 days. 10 capsule [place], Rolly Salter E, [place]   predniSONE (DELTASONE) 10 MG tablet Take 2 tablets (20 mg total) by mouth daily for 5 days. 10 tablet Gustavus Bryant, [place]      PDMP not reviewed this encounter.   Gustavus Bryant, [place] 11/30/20 [phone]

## 2020-11-30 NOTE — ED Triage Notes (Signed)
Complains of cough, headache, body aches, sweating episodes and chills.  Onset of symptoms 2 days ago

## 2020-11-30 NOTE — Discharge Instructions (Signed)
You have tested positive for influenza A.  You have been prescribed Tamiflu to treat this.  You have also been prescribed prednisone steroid to help with shortness of breath.  Please go the hospital if symptoms significantly worsen.

## 2020-12-09 ENCOUNTER — Ambulatory Visit: Payer: Self-pay | Admitting: *Deleted

## 2020-12-09 DIAGNOSIS — R21 Rash and other nonspecific skin eruption: Secondary | ICD-10-CM | POA: Diagnosis not present

## 2020-12-09 NOTE — Telephone Encounter (Signed)
Reason for Disposition  SEVERE itching (i.e., interferes with sleep, normal activities or school)  Answer Assessment - Initial Assessment Questions 1. APPEARANCE of RASH: "Describe the rash." (e.g., spots, blisters, raised areas, skin peeling, scaly)     Tiny red bumps 2. SIZE: "How big are the spots?" (e.g., tip of pen, eraser, coin; inches, centimeters)     pinhead 3. LOCATION: "Where is the rash located?"     Arms, back, torso 4. COLOR: "What color is the rash?" (Note: It is difficult to assess rash color in people with darker-colored skin. When this situation occurs, simply ask the caller to describe what they see.)     Skin color- dark 5. ONSET: "When did the rash begin?"     yesterday 6. FEVER: "Do you have a fever?" If Yes, ask: "What is your temperature, how was it measured, and when did it start?"     no 7. ITCHING: "Does the rash itch?" If Yes, ask: "How bad is the itch?" (Scale 1-10; or mild, moderate, severe)     Yes- severe 8. CAUSE: "What do you think is causing the rash?"     Unsure- detergent, scented wash 9. MEDICINE FACTORS: "Have you started any new medicines within the last 2 weeks?" (e.g., antibiotics)      Last week- started prednisone, Tamiflu 10. OTHER SYMPTOMS: "Do you have any other symptoms?" (e.g., dizziness, headache, sore throat, joint pain)       no 11. PREGNANCY: "Is there any chance you are pregnant?" "When was your last menstrual period?"       No- LMP- 10/30  Protocols used: Rash or Redness - [facility]

## 2020-12-09 NOTE — Telephone Encounter (Signed)
Patient is calling to report she has developed a rash- small pinprick size bumps- dark in color. Patient states she has itching that is severe and the rash seems to be spreading. Patient states it is on arms, torso, back. Advised Benadryl, hydrocortisone until she is seen for discomfort. No appointment available in office- advised UC

## 2020-12-14 DIAGNOSIS — J302 Other seasonal allergic rhinitis: Secondary | ICD-10-CM | POA: Diagnosis not present

## 2020-12-14 DIAGNOSIS — R21 Rash and other nonspecific skin eruption: Secondary | ICD-10-CM | POA: Diagnosis not present

## 2020-12-14 DIAGNOSIS — L501 Idiopathic urticaria: Secondary | ICD-10-CM | POA: Diagnosis not present

## 2020-12-14 DIAGNOSIS — L299 Pruritus, unspecified: Secondary | ICD-10-CM | POA: Diagnosis not present

## 2020-12-21 DIAGNOSIS — R21 Rash and other nonspecific skin eruption: Secondary | ICD-10-CM | POA: Diagnosis not present

## 2020-12-21 DIAGNOSIS — Z8249 Family history of ischemic heart disease and other diseases of the circulatory system: Secondary | ICD-10-CM | POA: Diagnosis not present

## 2020-12-21 DIAGNOSIS — L299 Pruritus, unspecified: Secondary | ICD-10-CM | POA: Diagnosis not present

## 2020-12-21 DIAGNOSIS — J302 Other seasonal allergic rhinitis: Secondary | ICD-10-CM | POA: Diagnosis not present

## 2020-12-21 DIAGNOSIS — Z833 Family history of diabetes mellitus: Secondary | ICD-10-CM | POA: Diagnosis not present

## 2020-12-21 DIAGNOSIS — L501 Idiopathic urticaria: Secondary | ICD-10-CM | POA: Diagnosis not present

## 2020-12-24 IMAGING — US A
1 series · 5 of 5 positions shown · non-contrast
Comparison: None.

CLINICAL DATA: 19-year-old female with diffuse bilateral breast
pain, increased in both UPPER OUTER breast.

EXAM:
ULTRASOUND OF THE BILATERAL BREAST

[Series 1: a · 0.07mm/px · 5 of 5 slices shown]
[im 1/5]
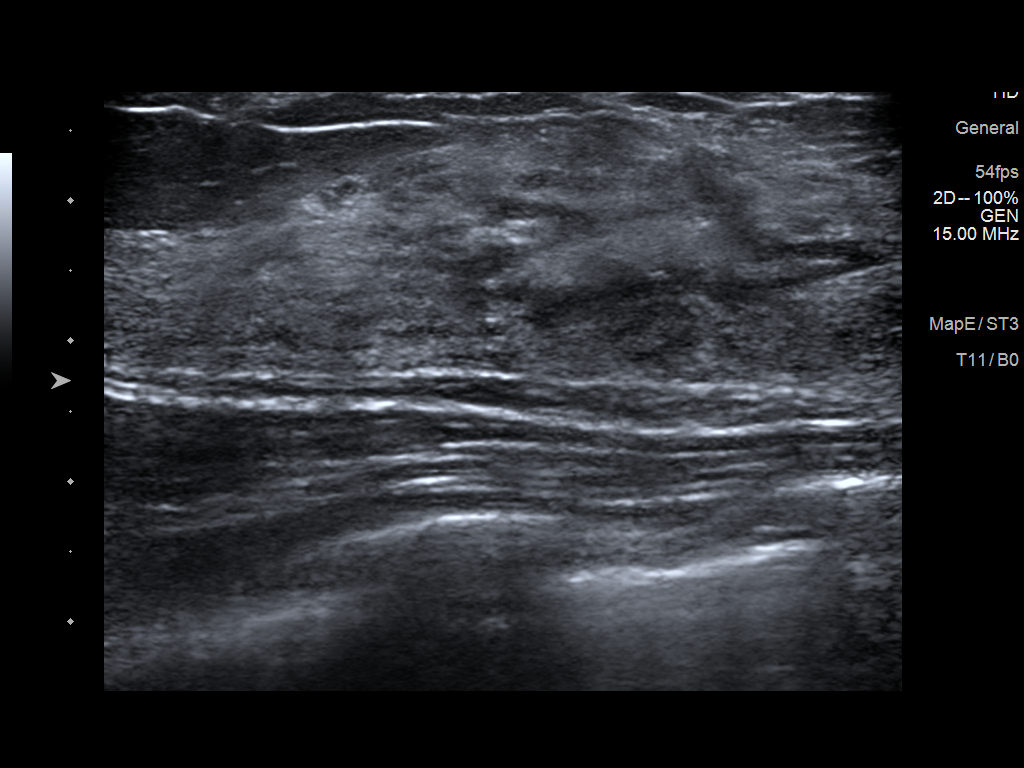
[im 2/5]
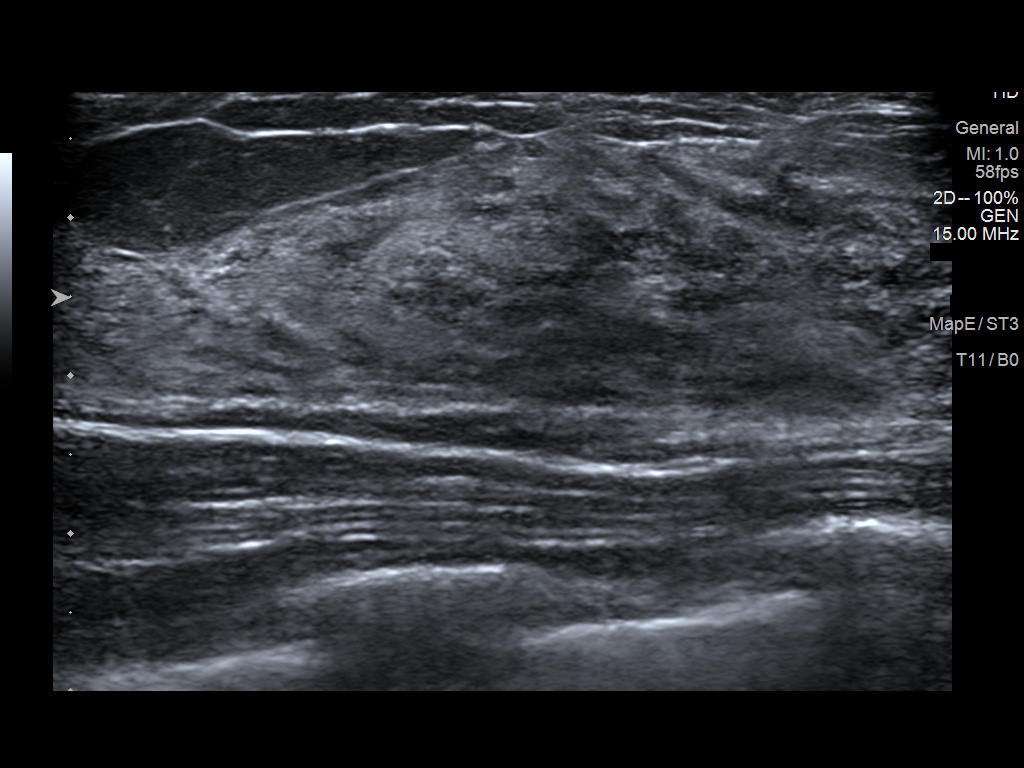
[im 3/5]
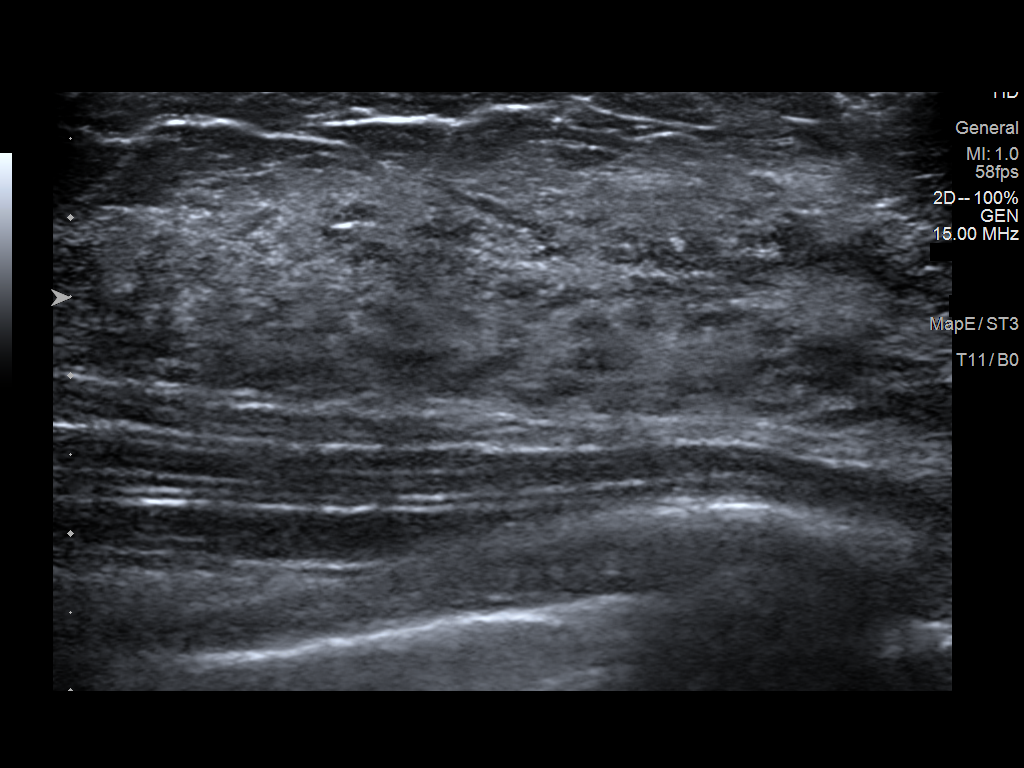
[im 4/5]
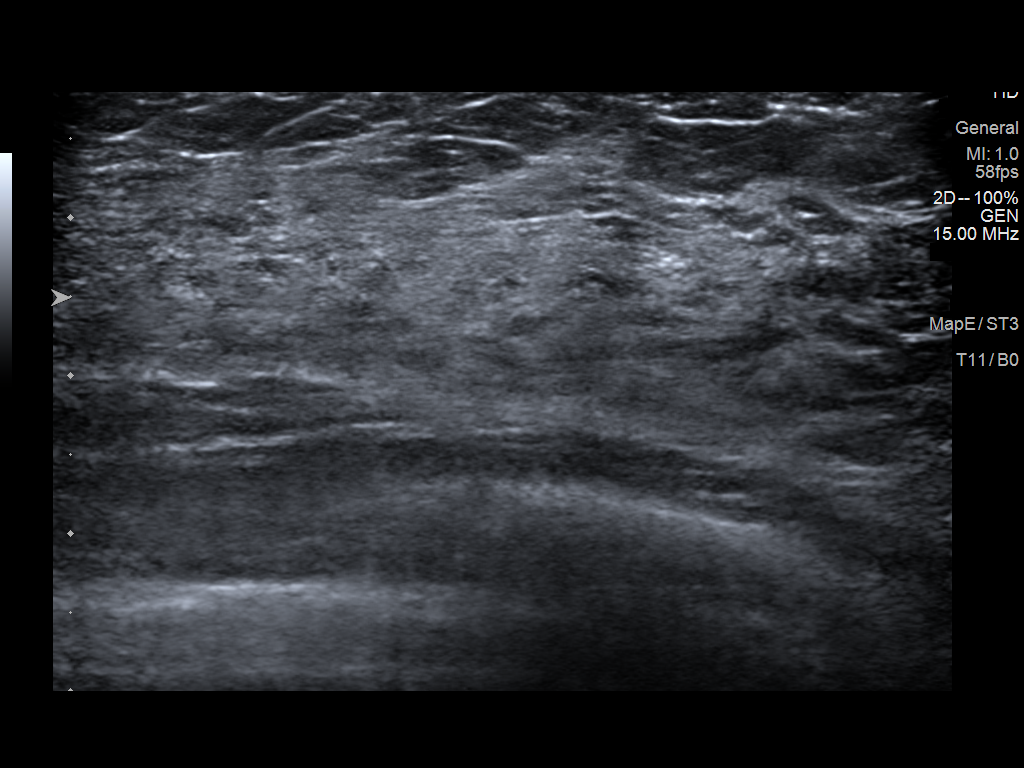
[im 5/5]
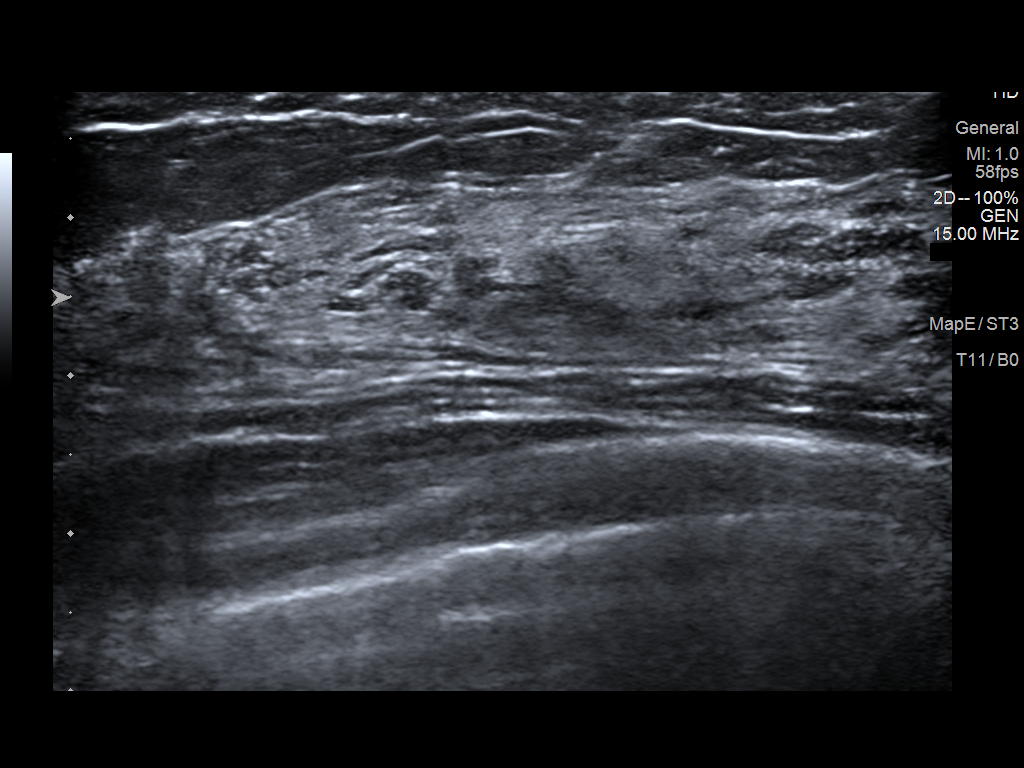

[5 of 5 positions shown; findings below may reference images not displayed]

FINDINGS: Targeted ultrasound is performed, showing normal dense
fibroglandular tissue within both breast. No solid or cystic mass,
distortion or abnormal shadowing identified.
IMPRESSION: No sonographic abnormalities within either breast.

RECOMMENDATION:
Consider clinical follow-up as indicated. Any further workup should
be based on clinical grounds.

I have discussed the findings, causes and remedies of breast pain
and recommendations with the patient. If applicable, a reminder
letter will be sent to the patient regarding the next appointment.

BI-RADS CATEGORY  1: Negative.

## 2021-01-03 DIAGNOSIS — L299 Pruritus, unspecified: Secondary | ICD-10-CM | POA: Diagnosis not present

## 2021-01-03 DIAGNOSIS — R21 Rash and other nonspecific skin eruption: Secondary | ICD-10-CM | POA: Diagnosis not present

## 2021-01-03 DIAGNOSIS — L501 Idiopathic urticaria: Secondary | ICD-10-CM | POA: Diagnosis not present

## 2021-01-03 DIAGNOSIS — Q809 Congenital ichthyosis, unspecified: Secondary | ICD-10-CM | POA: Diagnosis not present

## 2021-01-05 ENCOUNTER — Ambulatory Visit: Payer: Medicaid Other | Admitting: Physician Assistant

## 2021-01-25 ENCOUNTER — Ambulatory Visit: Payer: Medicaid Other | Admitting: Physician Assistant

## 2021-02-23 ENCOUNTER — Ambulatory Visit: Payer: Medicaid Other | Attending: Physician Assistant | Admitting: Physician Assistant

## 2021-02-23 ENCOUNTER — Encounter: Payer: Self-pay | Admitting: Physician Assistant

## 2021-02-23 ENCOUNTER — Emergency Department (HOSPITAL_BASED_OUTPATIENT_CLINIC_OR_DEPARTMENT_OTHER)
Admission: EM | Admit: 2021-02-23 | Discharge: 2021-02-24 | Disposition: A | Discharge status: Home / Self Care | Payer: Medicaid Other | Attending: Emergency Medicine | Admitting: Emergency Medicine

## 2021-02-23 ENCOUNTER — Encounter (HOSPITAL_BASED_OUTPATIENT_CLINIC_OR_DEPARTMENT_OTHER): Payer: Self-pay

## 2021-02-23 ENCOUNTER — Other Ambulatory Visit: Payer: Self-pay

## 2021-02-23 VITALS — BP 124/74 | HR 76 | Temp 98.8°F | Resp 16 | Ht 66.5 in | Wt 168.0 lb

## 2021-02-23 DIAGNOSIS — R630 Anorexia: Secondary | ICD-10-CM | POA: Diagnosis not present

## 2021-02-23 DIAGNOSIS — E860 Dehydration: Secondary | ICD-10-CM | POA: Diagnosis not present

## 2021-02-23 DIAGNOSIS — R5383 Other fatigue: Secondary | ICD-10-CM

## 2021-02-23 DIAGNOSIS — R109 Unspecified abdominal pain: Secondary | ICD-10-CM

## 2021-02-23 LAB — CBC
HCT: 46.1 % — ABNORMAL HIGH (ref 36.0–46.0)
Hemoglobin: 15 g/dL (ref 12.0–15.0)
MCH: 27.2 pg (ref 26.0–34.0)
MCHC: 32.5 g/dL (ref 30.0–36.0)
MCV: 83.7 fL (ref 80.0–100.0)
Platelets: 233 10*3/uL (ref 150–400)
RBC: 5.51 MIL/uL — ABNORMAL HIGH (ref 3.87–5.11)
RDW: 13.8 % (ref 11.5–15.5)
WBC: 5.4 10*3/uL (ref 4.0–10.5)
nRBC: 0 % (ref 0.0–0.2)

## 2021-02-23 LAB — POCT URINALYSIS DIP (CLINITEK)
Blood, UA: NEGATIVE
Glucose, UA: NEGATIVE mg/dL
Leukocytes, UA: NEGATIVE
Nitrite, UA: NEGATIVE
POC PROTEIN,UA: NEGATIVE
Spec Grav, UA: >=1.03 — AB (ref 1.010–1.025)
Urobilinogen, UA: 0.2 E.U./dL
pH, UA: 6.5 (ref 5.0–8.0)

## 2021-02-23 LAB — COMPREHENSIVE METABOLIC PANEL
ALT: 31 U/L (ref 0–44)
AST: 23 U/L (ref 15–41)
Albumin: 5 g/dL (ref 3.5–5.0)
Alkaline Phosphatase: 80 U/L (ref 38–126)
Anion gap: 12 (ref 5–15)
BUN: 9 mg/dL (ref 6–20)
CO2: 26 mmol/L (ref 22–32)
Calcium: 10.6 mg/dL — ABNORMAL HIGH (ref 8.9–10.3)
Chloride: 100 mmol/L (ref 98–111)
Creatinine, Ser: 0.89 mg/dL (ref 0.44–1.00)
GFR, Estimated: >60 mL/min (ref >60)
Glucose, Bld: 115 mg/dL — ABNORMAL HIGH (ref 70–99)
Potassium: 3.9 mmol/L (ref 3.5–5.1)
Sodium: 138 mmol/L (ref 135–145)
Total Bilirubin: 0.4 mg/dL (ref 0.3–1.2)
Total Protein: 8.3 g/dL — ABNORMAL HIGH (ref 6.5–8.1)

## 2021-02-23 LAB — PREGNANCY, URINE: Preg Test, Ur: NEGATIVE

## 2021-02-23 LAB — URINALYSIS, ROUTINE W REFLEX MICROSCOPIC
Bilirubin Urine: NEGATIVE
Glucose, UA: NEGATIVE mg/dL
Hgb urine dipstick: NEGATIVE
Ketones, ur: NEGATIVE mg/dL
Leukocytes,Ua: NEGATIVE
Nitrite: NEGATIVE
Protein, ur: NEGATIVE mg/dL
Specific Gravity, Urine: 1.009 (ref 1.005–1.030)
pH: 6 (ref 5.0–8.0)

## 2021-02-23 LAB — LIPASE, BLOOD: Lipase: 27 U/L (ref 11–51)

## 2021-02-23 LAB — POCT URINE PREGNANCY: Preg Test, Ur: NEGATIVE

## 2021-02-23 NOTE — ED Triage Notes (Signed)
Pt c/o decreased appetite, weight loss, abd pain that is worse in the AM x 2 weeks. Pt states she saw her PCP, but they didn't tell her anything. Pt saw she had some abnormal values on a urine test from her MyChart which is why she presented to the ED today.

## 2021-02-23 NOTE — Progress Notes (Signed)
Request for labs -no appetite x 2 weeks -weight lost - abdominal discomfort in the mornings

## 2021-02-23 NOTE — Patient Instructions (Signed)
Drink 80-100 ounces water daily.  Dehydration, Adult Dehydration is condition in which there is not enough water or other fluids in the body. This happens when a person loses more fluids than he or she takes in. Important body parts cannot work right without the right amount of fluids. Any loss of fluids from the body can cause dehydration. Dehydration can be mild, worse, or very bad. It should be treated right away to keep it from getting very bad. What are the causes? This condition may be caused by: Conditions that cause loss of water or other fluids, such as: Watery poop (diarrhea). Vomiting. Sweating a lot. Peeing (urinating) a lot. Not drinking enough fluids, especially when you: Are ill. Are doing things that take a lot of energy to do. Other illnesses and conditions, such as fever or infection. Certain medicines, such as medicines that take extra fluid out of the body (diuretics). Lack of safe drinking water. Not being able to get enough water and food. What increases the risk? The following factors may make you more likely to develop this condition: Having a long-term (chronic) illness that has not been treated the right way, such as: Diabetes. Heart disease. Kidney disease. Being 39 years of age or older. Having a disability. Living in a place that is high above the ground or sea (high in altitude). The thinner, dried air causes more fluid loss. Doing exercises that put stress on your body for a long time. What are the signs or symptoms? Symptoms of dehydration depend on how bad it is. Mild or worse dehydration Thirst. Dry lips or dry mouth. Feeling dizzy or light-headed, especially when you stand up from sitting. Muscle cramps. Your body making: Dark pee (urine). Pee may be the color of tea. Less pee than normal. Less tears than normal. Headache. Very bad dehydration Changes in skin. Skin may: Be cold to the touch (clammy). Be blotchy or pale. Not go back to  normal right after you lightly pinch it and let it go. Little or no tears, pee, or sweat. Changes in vital signs, such as: Fast breathing. Low blood pressure. Weak pulse. Pulse that is more than 100 beats a minute when you are sitting still. Other changes, such as: Feeling very thirsty. Eyes that look hollow (sunken). Cold hands and feet. Being mixed up (confused). Being very tired (lethargic) or having trouble waking from sleep. Short-term weight loss. Loss of consciousness. How is this treated? Treatment for this condition depends on how bad it is. Treatment should start right away. Do not wait until your condition gets very bad. Very bad dehydration is an emergency. You will need to go to a hospital. Mild or worse dehydration can be treated at home. You may be asked to: Drink more fluids. Drink an oral rehydration solution (ORS). This drink helps get the right amounts of fluids and salts and minerals in the blood (electrolytes). Very bad dehydration can be treated: With fluids through an IV tube. By getting normal levels of salts and minerals in your blood. This is often done by giving salts and minerals through a tube. The tube is passed through your nose and into your stomach. By treating the root cause. Follow these instructions at home: Oral rehydration solution If told by your doctor, drink an ORS: Make an ORS. Use instructions on the package. Start by drinking small amounts, about  cup (120 mL) every 5-10 minutes. Slowly drink more until you have had the amount that your doctor said to have.  Eating and drinking     Drink enough clear fluid to keep your pee pale yellow. If you were told to drink an ORS, finish the ORS first. Then, start slowly drinking other clear fluids. Drink fluids such as: Water. Do not drink only water. Doing that can make the salt (sodium) level in your body get too low. Water from ice chips you suck on. Fruit juice that you have added water to  (diluted). Low-calorie sports drinks. Eat foods that have the right amounts of salts and minerals, such as: Bananas. Oranges. Potatoes. Tomatoes. Spinach. Do not drink alcohol. Avoid: Drinks that have a lot of sugar. These include: High-calorie sports drinks. Fruit juice that you did not add water to. Soda. Caffeine. Foods that are greasy or have a lot of fat or sugar. General instructions Take over-the-counter and prescription medicines only as told by your doctor. Do not take salt tablets. Doing that can make the salt level in your body get too high. Return to your normal activities as told by your doctor. Ask your doctor what activities are safe for you. Keep all follow-up visits as told by your doctor. This is important. Contact a doctor if: You have pain in your belly (abdomen) and the pain: Gets worse. Stays in one place. You have a rash. You have a stiff neck. You get angry or annoyed (irritable) more easily than normal. You are more tired or have a harder time waking than normal. You feel: Weak or dizzy. Very thirsty. Get help right away if you have: Any symptoms of very bad dehydration. Symptoms of vomiting, such as: You cannot eat or drink without vomiting. Your vomiting gets worse or does not go away. Your vomit has blood or green stuff in it. Symptoms that get worse with treatment. A fever. A very bad headache. Problems with peeing or pooping (having a bowel movement), such as: Watery poop that gets worse or does not go away. Blood in your poop (stool). This may cause poop to look black and tarry. Not peeing in 6-8 hours. Peeing only a small amount of very dark pee in 6-8 hours. Trouble breathing. These symptoms may be an emergency. Do not wait to see if the symptoms will go away. Get medical help right away. Call your local emergency services (911 in the [place] Do not drive yourself to the hospital. Summary Dehydration is a condition in which there is not  enough water or other fluids in the body. This happens when a person loses more fluids than he or she takes in. Treatment for this condition depends on how bad it is. Treatment should be started right away. Do not wait until your condition gets very bad. Drink enough clear fluid to keep your pee pale yellow. If you were told to drink an oral rehydration solution (ORS), finish the ORS first. Then, start slowly drinking other clear fluids. Take over-the-counter and prescription medicines only as told by your doctor. Get help right away if you have any symptoms of very bad dehydration. This information is not intended to replace advice given to you by your health care provider. Make sure you discuss any questions you have with your health care provider. Document Revised: 09/04/2018 Document Reviewed: 09/04/2018 Elsevier Patient Education  2022 [employer].

## 2021-02-23 NOTE — Progress Notes (Signed)
Patient ID: Rachael Tanner, female   DOB: 2000/10/23, 21 y.o.   MRN: [id number]     Rachael Tanner, is a 21 y.o. female  [id number]  [id number]  DOB - 2000/09/28  Chief Complaint  Patient presents with   Fatigue       Subjective:   Rachael Tanner is a 21 y.o. female here today with multiple vague complaints.  Wants to have a d-dimer done due to occasional leg cramping.  Of note, she has had 2 D-Dimers done within the last year and they were both normal.  Wants to be tested for diabetes.  Last A1C a year ago was normal.  Also 2 week h/o poor appetite but started to improve 2 days ago.  Some vague abdominal discomfort.  Nothing localized.  Denies diarrhea or constipation.  Feels kind of weak and fatigued. No urinary s/sx.  No pelvic pain.  Admits to poor food and liquid intake.  Denies calf pain/redness/or swelling.     Patient has No headache, No chest pain, No abdominal pain - No Nausea, No new weakness tingling or numbness, No Cough - SOB.  No problems updated.  ALLERGIES: No Known Allergies  PAST MEDICAL HISTORY: Past Medical History:  Diagnosis Date   Anxiety    Anxiety    Asthma    COVID-19    Marijuana use    Palpitations    STD (female)    Tobacco abuse     MEDICATIONS AT HOME: Prior to Admission medications   Medication Sig Start Date End Date Taking? Authorizing Provider  albuterol (VENTOLIN HFA) 108 (90 Base) MCG/ACT inhaler Inhale 2 puffs into the lungs every 6 (six) hours as needed for wheezing or shortness of breath. 05/24/20  Yes Ladell Pier, MD  famotidine (PEPCID) 20 MG tablet Take 1 tablet (20 mg total) by mouth 2 (two) times daily. Patient not taking: Reported on 11/30/2020 02/27/20   Wieters, Hallie C, PA-C  Phenyleph-CPM-DM-APAP (TYLENOL CHILDRENS COLD/FLU PO) Take by mouth.    [provider]    ROS: Neg HEENT Neg resp Neg cardiac Neg MS Neg psych Neg neuro  Objective:   Vitals:   02/23/21 0932  BP: 124/74  Pulse: 76   Resp: 16  Temp: 98.8 F (37.1 C)  TempSrc: Oral  SpO2: 97%  Weight: 168 lb (76.2 kg)  Height: 5' 6.5" (1.689 m)   Exam General appearance : Awake, alert, not in any distress. Speech Clear. Not toxic looking.  Difficult to obtain history HEENT: Atraumatic and Normocephalic, pupils equally reactive to light and accomodation Neck: Supple, no JVD. No cervical lymphadenopathy.  Chest: Good air entry bilaterally, CTAB.  No rales/rhonchi/wheezing CVS: S1 S2 regular, no murmurs.  Abdomen: Bowel sounds present, Non tender and not distended with no gaurding, rigidity or rebound. Extremities: B/L Lower Ext shows no edema, both legs are warm to touch Neurology: Awake alert, and oriented X 3, CN II-XII intact, Non focal Skin: No Rash  Data Review Lab Results  Component Value Date   HGBA1C 5.4 11/24/2019    Assessment & Plan   1. Abdominal pain, unspecified abdominal location Non-acute abdomen.   - POCT URINALYSIS DIP (CLINITEK) - POCT urine pregnancy - Comprehensive metabolic panel - Vitamin D, 25-hydroxy  2. Other fatigue - CBC with Differential/Platelet - Thyroid Panel With TSH - Hemoglobin A1c  3. Dehydration-ketones but no glucose in urine.   Increase water intake 80-100 ounces water daily - Hemoglobin A1c wants diabetes check    Patient have  been counseled extensively about nutrition and exercise. Other issues discussed during this visit include: low cholesterol diet, weight control and daily exercise, foot care, annual eye examinations at Ophthalmology, importance of adherence with medications and regular follow-up. We also discussed long term complications of uncontrolled diabetes and hypertension.   Return in about 3 months (around 05/24/2021) for PCP-ongoing issues.  The patient was given clear instructions to go to ER or return to medical center if symptoms don't improve, worsen or new problems develop. The patient verbalized understanding. The patient was told to call  to get lab results if they haven't heard anything in the next week.      Freeman Caldron, PA-C [facility] and [facility] [place], [place]   02/23/2021, 10:06 AM

## 2021-02-24 LAB — COMPREHENSIVE METABOLIC PANEL
ALT: 27 IU/L (ref 0–32)
AST: 24 IU/L (ref 0–40)
Albumin/Globulin Ratio: 2 (ref 1.2–2.2)
Albumin: 4.6 g/dL (ref 3.9–5.0)
Alkaline Phosphatase: 83 IU/L (ref 44–121)
BUN/Creatinine Ratio: 9 (ref 9–23)
BUN: 8 mg/dL (ref 6–20)
Bilirubin Total: 0.5 mg/dL (ref 0.0–1.2)
CO2: 22 mmol/L (ref 20–29)
Calcium: 9.9 mg/dL (ref 8.7–10.2)
Chloride: 103 mmol/L (ref 96–106)
Creatinine, Ser: 0.85 mg/dL (ref 0.57–1.00)
Globulin, Total: 2.3 g/dL (ref 1.5–4.5)
Glucose: 88 mg/dL (ref 70–99)
Potassium: 4.4 mmol/L (ref 3.5–5.2)
Sodium: 141 mmol/L (ref 134–144)
Total Protein: 6.9 g/dL (ref 6.0–8.5)
eGFR: 99 mL/min/{1.73_m2} (ref >59)

## 2021-02-24 LAB — HEMOGLOBIN A1C
Est. average glucose Bld gHb Est-mCnc: 111 mg/dL
Hgb A1c MFr Bld: 5.5 % (ref 4.8–5.6)

## 2021-02-24 LAB — CBC WITH DIFFERENTIAL/PLATELET
Basophils Absolute: 0 10*3/uL (ref 0.0–0.2)
Basos: 1 %
EOS (ABSOLUTE): 0.1 10*3/uL (ref 0.0–0.4)
Eos: 2 %
Hematocrit: 45.6 % (ref 34.0–46.6)
Hemoglobin: 14.7 g/dL (ref 11.1–15.9)
Immature Grans (Abs): 0 10*3/uL (ref 0.0–0.1)
Immature Granulocytes: 0 %
Lymphocytes Absolute: 1.5 10*3/uL (ref 0.7–3.1)
Lymphs: 34 %
MCH: 27.3 pg (ref 26.6–33.0)
MCHC: 32.2 g/dL (ref 31.5–35.7)
MCV: 85 fL (ref 79–97)
Monocytes Absolute: 0.5 10*3/uL (ref 0.1–0.9)
Monocytes: 10 %
Neutrophils Absolute: 2.3 10*3/uL (ref 1.4–7.0)
Neutrophils: 53 %
Platelets: 218 10*3/uL (ref 150–450)
RBC: 5.39 x10E6/uL — ABNORMAL HIGH (ref 3.77–5.28)
RDW: 13.8 % (ref 11.7–15.4)
WBC: 4.4 10*3/uL (ref 3.4–10.8)

## 2021-02-24 LAB — THYROID PANEL WITH TSH
Free Thyroxine Index: 2.6 (ref 1.2–4.9)
T3 Uptake Ratio: 30 % (ref 24–39)
T4, Total: 8.6 ug/dL (ref 4.5–12.0)
TSH: 2.63 u[IU]/mL (ref 0.450–4.500)

## 2021-02-24 LAB — VITAMIN D 25 HYDROXY (VIT D DEFICIENCY, FRACTURES): Vit D, 25-Hydroxy: 26.2 ng/mL — ABNORMAL LOW (ref 30.0–100.0)

## 2021-02-24 NOTE — ED Provider Notes (Signed)
[facility] EMERGENCY DEPT Provider Note   CSN: [id number] Arrival date & time: 02/23/21  2100     History  Chief Complaint  Patient presents with   Abdominal Pain    Rachael Tanner is a 21 y.o. female.  21 year old female that presents emerged part today with a couple weeks of abdominal symptoms.  She is described that his pain initially but then subsequently she said it is like her abdomen is turning and making funny noises more often than normal.  This happens in the morning when she does not eat and drink very much and then she feels off during the day.  It seems to get better after she eats and drinks.  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  She is unclear was causing his symptoms so she went to her primary doctor today and the urine showed bilirubin, ketones and some other abnormalities which worried her so she came here to be evaluated.  She states that she had weight loss during this time as well but she states last couple days she is actually felt normal and seems to be gaining her weight back.  No fevers.   Abdominal Pain     Home Medications Prior to Admission medications   Medication Sig Start Date End Date Taking? Authorizing Provider  albuterol (VENTOLIN HFA) 108 (90 Base) MCG/ACT inhaler Inhale 2 puffs into the lungs every 6 (six) hours as needed for wheezing or shortness of breath. 05/24/20   Ladell Pier, MD  famotidine (PEPCID) 20 MG tablet Take 1 tablet (20 mg total) by mouth 2 (two) times daily. Patient not taking: Reported on 11/30/2020 02/27/20   Wieters, Hallie C, PA-C  Phenyleph-CPM-DM-APAP (TYLENOL CHILDRENS COLD/FLU PO) Take by mouth.    [provider]      Allergies    Patient has no known allergies.    Review of Systems   Review of Systems  Gastrointestinal:  Positive for abdominal pain.   Physical Exam Updated Vital Signs BP 122/76 (BP Location: Right Arm)    Pulse  88    Temp 98.5 F (36.9 C) (Oral)    Resp 16    Ht 5' 6.5" (1.689 m)    Wt 76.2 kg    LMP 02/02/2021    SpO2 98%    BMI 26.71 kg/m  Physical Exam Vitals and nursing note reviewed.  Constitutional:      Appearance: She is well-developed.  HENT:     Head: Normocephalic and atraumatic.  Cardiovascular:     Rate and Rhythm: Normal rate and regular rhythm.  Pulmonary:     Effort: No respiratory distress.     Breath sounds: No stridor.  Abdominal:     General: Bowel sounds are normal. There is no distension.     Palpations: Abdomen is soft.     Tenderness: There is no abdominal tenderness.  Musculoskeletal:     Cervical back: Normal range of motion.  Skin:    General: Skin is warm and dry.  Neurological:     Mental Status: She is alert.    ED Results / Procedures / Treatments   Labs (all labs ordered are listed, but only abnormal results are displayed) Labs Reviewed  COMPREHENSIVE METABOLIC PANEL - Abnormal; Notable for the following components:      Result Value   Glucose, Bld 115 (*)    Calcium 10.6 (*)    Total Protein 8.3 (*)  All other components within normal limits  CBC - Abnormal; Notable for the following components:   RBC 5.51 (*)    HCT 46.1 (*)    All other components within normal limits  URINALYSIS, ROUTINE W REFLEX MICROSCOPIC - Abnormal; Notable for the following components:   Color, Urine COLORLESS (*)    All other components within normal limits  LIPASE, BLOOD  PREGNANCY, URINE    EKG None  Radiology No results found.  Procedures Procedures    Medications Ordered in ED Medications - No data to display  ED Course/ Medical Decision Making/ A&P                           Medical Decision Making Amount and/or Complexity of Data Reviewed Labs: ordered.   I explained her lab findings to her that she is probably just dehydrated she had a eating or drink anything prior to the labs this morning.  I think that is also related to her symptoms  over the last couple weeks.  She still healthy weight.  She is following up with her PCP.  She still pending some labs through them.  Her labs here are reassuring.  Low suspicion for any emergent causes for her symptoms and stable for discharge.  Final Clinical Impression(s) / ED Diagnoses Final diagnoses:  Decreased appetite  Dehydration    Rx / DC Orders ED Discharge Orders     None         Aryonna Gunnerson, Corene Cornea, MD 02/24/21 [phone]

## 2021-02-27 ENCOUNTER — Telehealth: Payer: Self-pay

## 2021-02-27 NOTE — Telephone Encounter (Incomplete Revision)
Transition Care Management Unsuccessful Follow-up Telephone Call  Date of discharge and from where:  02/24/2021 from [facility]  Attempts:  1st Attempt  Reason for unsuccessful TCM follow-up call:  Unable to leave message

## 2021-02-27 NOTE — Telephone Encounter (Addendum)
Transition Care Management Follow-up Telephone Call Date of discharge and from where: 02/24/2021 from [facility] How have you been since you were released from the hospital? Pt stated that she is feeling better.  Any questions or concerns? No  Items Reviewed: Did the pt receive and understand the discharge instructions provided? Yes  Medications obtained and verified? Yes  Other? No  Any new allergies since your discharge? No  Dietary orders reviewed? No Do you have support at home? Yes   Functional Questionnaire: (I = Independent and D = Dependent) ADLs: I Bathing/Dressing- I Meal Prep- I Eating- I Maintaining continence- I Transferring/Ambulation- I Managing Meds- I   Follow up appointments reviewed:  PCP Hospital f/u appt confirmed? No  Pt encouraged to reach out to PCP for a follow up.  [facility] f/u appt confirmed? No   Are transportation arrangements needed? No  If their condition worsens, is the pt aware to call PCP or go to the Emergency Dept.? Yes Was the patient provided with contact information for the PCP's office or ED? Yes Was to pt encouraged to call back with questions or concerns? Yes

## 2021-03-01 ENCOUNTER — Other Ambulatory Visit: Payer: Self-pay | Admitting: Physician Assistant

## 2021-03-01 MED ORDER — VITAMIN D (ERGOCALCIFEROL) 1.25 MG (50000 UNIT) PO CAPS: 50000.0000 [IU] | ORAL_CAPSULE | ORAL | 0 refills | Status: DC

## 2021-03-06 ENCOUNTER — Ambulatory Visit: Payer: Medicaid Other | Attending: Internal Medicine | Admitting: Internal Medicine

## 2021-03-06 DIAGNOSIS — R634 Abnormal weight loss: Secondary | ICD-10-CM | POA: Diagnosis not present

## 2021-03-06 NOTE — Progress Notes (Signed)
Patient ID: Rachael HeinzKeYuara E Trow, female   DOB: 01/09/2001, 21 y.o.   MRN: [id number] Virtual Visit via Telephone Note  I connected with Rachael Tanner on 03/06/2021 at 8:18 a.m by telephone and verified that I am speaking with the correct person using two identifiers  Location: Patient: home Provider: office  Participants: Myself Patient   I discussed the limitations, risks, security and privacy concerns of performing an evaluation and management service by telephone and the availability of in person appointments. I also discussed with the patient that there may be a patient responsible charge related to this service. The patient expressed understanding and agreed to proceed.   History of Present Illness: Pt with hx of anxiety, recurrent CP, mastalgia, COVID infection 02/2019, tob dep.  This is an UC visit for f/u ER  Pt seen by PA 02/23/2021 with vague complaints including leg cramps, abdominal discomfort and decreased appetite.  She denied any diarrhea or constipation.  Lab test done revealed mild vitamin D deficiency of 26, normal A1c, normal thyroid panel, slight increase in RBC.  She subsequently went to the emergency room the same day where she complained of abdominal discomfort which she described as her stomach making noises when she does not eat or drink in the mornings.  Physical exam of the abdomen was unrevealing.  Urine pregnancy test was negative.  Chemistry study revealed mild elevation of calcium at 10.6 and slight elevation of hematocrit. Patient tells me today that she was told by the ER to follow-up to have blood test done because of her increased calcium and increased total protein level.  Of note, she had mild vitamin D deficiency on recent blood test.  However she has not started taking vitamin D supplement as yet.  Her TSH level was normal.  She tells me that she has loss some weight over the past 3 weeks unintentionally.  In looking at her chart, she has had 3 pound weight loss  between April of last year and when she was seen on the 19th of this month.  However patient tells me that [facility] and her last week there was in the 180s.  She tells me that sometimes she just does not have the appetite to eat anything.  She denies any feeling of depression or any issues that she has that she thinks may contribute to the weight loss.  She denies any fever.  No productive chronic cough.  No changes in bowel habits.  Outpatient Encounter Medications as of 03/06/2021  Medication Sig   albuterol (VENTOLIN HFA) 108 (90 Base) MCG/ACT inhaler Inhale 2 puffs into the lungs every 6 (six) hours as needed for wheezing or shortness of breath.   famotidine (PEPCID) 20 MG tablet Take 1 tablet (20 mg total) by mouth 2 (two) times daily. (Patient not taking: Reported on 11/30/2020)   Phenyleph-CPM-DM-APAP (TYLENOL CHILDRENS COLD/FLU PO) Take by mouth.   Vitamin D, Ergocalciferol, (DRISDOL) 1.25 MG (50000 UNIT) CAPS capsule Take 1 capsule (50,000 Units total) by mouth every 7 (seven) days.   No facility-administered encounter medications on file as of 03/06/2021.      Observations/Objective: Wt Readings from Last 3 Encounters:  02/23/21 168 lb (76.2 kg)  02/23/21 168 lb (76.2 kg)  05/26/20 171 lb (77.6 kg)     Assessment and Plan: 1. Unexplained weight loss Advised patient to eat smaller but more frequent meals if she feels she is not able to eat 3 large meals a  day.  Also recommend doing fruit shakes to supplement meals.  We will check an HIV test.  Follow-up if issue persists.  2. Hypercalcemia We will check an intact parathyroid hormone level along with calcium level.    Follow Up Instructions: PRN   I discussed the assessment and treatment plan with the patient. The patient was provided an opportunity to ask questions and all were answered. The patient agreed with the plan and demonstrated an understanding of the instructions.   The  patient was advised to call back or seek an in-person evaluation if the symptoms worsen or if the condition fails to improve as anticipated.  I  Spent 10 minutes on this telephone encounter  This note has been created with [employer], quantity. Any transcriptional errors are unintentional.  Karle Plumber, MD

## 2021-03-08 ENCOUNTER — Other Ambulatory Visit: Payer: Self-pay

## 2021-03-08 ENCOUNTER — Other Ambulatory Visit: Payer: Medicaid Other

## 2021-03-08 ENCOUNTER — Ambulatory Visit: Payer: Medicaid Other | Attending: Internal Medicine

## 2021-03-08 DIAGNOSIS — R634 Abnormal weight loss: Secondary | ICD-10-CM

## 2021-03-09 LAB — PTH, INTACT AND CALCIUM
Calcium: 9.6 mg/dL (ref 8.7–10.2)
PTH: 38 pg/mL (ref 15–65)

## 2021-03-09 LAB — HIV ANTIBODY (ROUTINE TESTING W REFLEX): HIV Screen 4th Generation wRfx: NONREACTIVE

## 2021-04-15 ENCOUNTER — Other Ambulatory Visit: Payer: Self-pay

## 2021-04-15 ENCOUNTER — Ambulatory Visit
Admission: EM | Admit: 2021-04-15 | Discharge: 2021-04-15 | Disposition: A | Discharge status: Home / Self Care | Payer: Medicaid Other | Attending: Internal Medicine | Admitting: Internal Medicine

## 2021-04-15 ENCOUNTER — Encounter: Payer: Self-pay | Admitting: Emergency Medicine

## 2021-04-15 DIAGNOSIS — B349 Viral infection, unspecified: Secondary | ICD-10-CM | POA: Insufficient documentation

## 2021-04-15 DIAGNOSIS — J029 Acute pharyngitis, unspecified: Secondary | ICD-10-CM | POA: Diagnosis not present

## 2021-04-15 LAB — POCT RAPID STREP A (OFFICE): Rapid Strep A Screen: NEGATIVE

## 2021-04-15 MED ORDER — CETIRIZINE HCL 10 MG PO TABS: 10.0000 mg | ORAL_TABLET | Freq: Every day | ORAL | 0 refills | Status: DC

## 2021-04-15 MED ORDER — FLUTICASONE PROPIONATE 50 MCG/ACT NA SUSP: 1.0000 | Freq: Every day | NASAL | 0 refills | Status: DC

## 2021-04-15 NOTE — ED Triage Notes (Signed)
Patient c/o body aches, slight cough and throat is slightly irritated x 1 week.  Denies any OTC meds. ?

## 2021-04-15 NOTE — ED Provider Notes (Signed)
?[name] ? ? ? ?CSN: [id number] ?Arrival date & time: 04/15/21  1303 ? ? ?  ? ?History   ?Chief Complaint ?Chief Complaint  ?Patient presents with  ? Generalized Body Aches  ? ? ?HPI ?Rachael Tanner is a 21 y.o. female.  ? ?Patient presents with generalized body aches and a sore throat that has been present for approximately 1 week.  She reports nasal congestion that was present at start of symptoms that has now resolved.  Denies cough, chest pain, shortness of breath, ear pain, nausea, vomiting, diarrhea, abdominal pain.  Denies any known fevers or sick contacts.  Denies taking any medications to help alleviate symptoms. ? ? ? ?Past Medical History:  ?Diagnosis Date  ? Anxiety   ? Anxiety   ? Asthma   ? COVID-19   ? Marijuana use   ? Palpitations   ? STD (female)   ? Tobacco abuse   ? ? ?Patient Active Problem List  ? Diagnosis Date Noted  ? Polycythemia 03/29/2020  ? Smoker 03/29/2020  ? PVC (premature ventricular contraction) 02/18/2020  ? Non-cardiac chest pain 11/25/2019  ? Palpitations 11/25/2019  ? Shortness of breath 11/25/2019  ? ? ?History reviewed. No pertinent surgical history. ? ?OB History   ?No obstetric history on file. ?  ? ? ? ?Home Medications   ? ?Prior to Admission medications   ?Medication Sig Start Date End Date Taking? Authorizing Provider  ?cetirizine (ZYRTEC) 10 MG tablet Take 1 tablet (10 mg total) by mouth daily for 10 days. 04/15/21 04/25/21 Yes Gustavus BryantMound, Cono Gebhard E, FNP  ?fluticasone (FLONASE) 50 MCG/ACT nasal spray Place 1 spray into both nostrils daily for 3 days. 04/15/21 04/18/21 Yes Gustavus BryantMound, Kyli Sorter E, FNP  ?Vitamin D, Ergocalciferol, (DRISDOL) 1.25 MG (50000 UNIT) CAPS capsule Take 1 capsule (50,000 Units total) by mouth every 7 (seven) days. 03/01/21  Yes Anders SimmondsMcClung, Angela M, PA-C  ?albuterol (VENTOLIN HFA) 108 (90 Base) MCG/ACT inhaler Inhale 2 puffs into the lungs every 6 (six) hours as needed for wheezing or shortness of breath. 05/24/20   Marcine MatarJohnson, Deborah B, MD  ?famotidine  (PEPCID) 20 MG tablet Take 1 tablet (20 mg total) by mouth 2 (two) times daily. ?Patient not taking: Reported on 11/30/2020 02/27/20   Lew DawesWieters, Hallie C, PA-C  ? ? ?Family History ?Family History  ?Problem Relation Age of Onset  ? Hypertension Mother   ? ? ?Social History ?Social History  ? ?Tobacco Use  ? Smoking status: Some Days  ?  Types: Cigars  ? Smokeless tobacco: Never  ?Vaping Use  ? Vaping Use: Never used  ?Substance Use Topics  ? Alcohol use: Yes  ?  Comment: "weekends"  ? Drug use: Not Currently  ?  Types: Marijuana  ?  Comment: last use May '21  ? ? ? ?Allergies   ?Patient has no known allergies. ? ? ?Review of Systems ?Review of Systems ?Per HPI ? ?Physical Exam ?Triage Vital Signs ?ED Triage Vitals  ?Enc Vitals Group  ?   BP 04/15/21 1319 113/69  ?   Pulse Rate 04/15/21 1319 76  ?   Resp 04/15/21 1319 18  ?   Temp 04/15/21 1319 98.3 ?F (36.8 ?C)  ?   Temp Source 04/15/21 1319 Oral  ?   SpO2 04/15/21 1319 97 %  ?   Weight --   ?   Height --   ?   Head Circumference --   ?   Peak Flow --   ?  Pain Score 04/15/21 1321 4  ?   Pain Loc --   ?   Pain Edu? --   ?   Excl. in GC? --   ? ?No data found. ? ?Updated Vital Signs ?BP 113/69 (BP Location: Right Arm)   Pulse 76   Temp 98.3 ?F (36.8 ?C) (Oral)   Resp 18   LMP 03/25/2021   SpO2 97%  ? ?Visual Acuity ?Right Eye Distance:   ?Left Eye Distance:   ?Bilateral Distance:   ? ?Right Eye Near:   ?Left Eye Near:    ?Bilateral Near:    ? ?Physical Exam ?Constitutional:   ?   General: She is not in acute distress. ?   Appearance: Normal appearance. She is not toxic-appearing or diaphoretic.  ?HENT:  ?   Head: Normocephalic and atraumatic.  ?   Right Ear: Tympanic membrane and ear canal normal.  ?   Left Ear: Tympanic membrane and ear canal normal.  ?   Nose: Congestion present.  ?   Mouth/Throat:  ?   Mouth: Mucous membranes are moist.  ?   Pharynx: Posterior oropharyngeal erythema present.  ?Eyes:  ?   Extraocular Movements: Extraocular movements intact.  ?    Conjunctiva/sclera: Conjunctivae normal.  ?   Pupils: Pupils are equal, round, and reactive to light.  ?Cardiovascular:  ?   Rate and Rhythm: Normal rate and regular rhythm.  ?   Pulses: Normal pulses.  ?   Heart sounds: Normal heart sounds.  ?Pulmonary:  ?   Effort: Pulmonary effort is normal. No respiratory distress.  ?   Breath sounds: Normal breath sounds. No stridor. No wheezing, rhonchi or rales.  ?Abdominal:  ?   General: Abdomen is flat. Bowel sounds are normal.  ?   Palpations: Abdomen is soft.  ?Musculoskeletal:     ?   General: Normal range of motion.  ?   Cervical back: Normal range of motion.  ?Skin: ?   General: Skin is warm and dry.  ?Neurological:  ?   General: No focal deficit present.  ?   Mental Status: She is alert and oriented to person, place, and time. Mental status is at baseline.  ?Psychiatric:     ?   Mood and Affect: Mood normal.     ?   Behavior: Behavior normal.  ? ? ? ?UC Treatments / Results  ?Labs ?(all labs ordered are listed, but only abnormal results are displayed) ?Labs Reviewed  ?CULTURE, GROUP A STREP [facility])  ?COVID-19, FLU A+B NAA  ?POCT RAPID STREP A (OFFICE)  ? ? ?EKG ? ? ?Radiology ?No results found. ? ?Procedures ?Procedures (including critical care time) ? ?Medications Ordered in UC ?Medications - No data to display ? ?Initial Impression / Assessment and Plan / UC Course  ?I have reviewed the triage vital signs and the nursing notes. ? ?Pertinent labs & imaging results that were available during my care of the patient were reviewed by me and considered in my medical decision making (see chart for details). ? ?  ? ?Patient's symptoms appear viral in etiology.  Rapid strep was negative.  No signs of peritonsillar abscess on exam. Throat culture, COVID-19, flu test pending.  Patient sent prescriptions to help alleviate symptoms.  Discussed return precautions with patient.  Patient verbalized understanding and was agreeable with plan. ?Final Clinical Impressions(s) / UC  Diagnoses  ? ?Final diagnoses:  ?Viral illness  ?Sore throat  ? ? ? ?Discharge Instructions   ? ?  ?  Your rapid strep test was negative.  Throat culture, COVID-19, flu test is pending.  We will call if it is positive.  You have been prescribed 2 medications to alleviate symptoms.  Please follow-up if symptoms persist or worsen. ? ? ? ? ?ED Prescriptions   ? ? Medication Sig Dispense Auth. Provider  ? cetirizine (ZYRTEC) 10 MG tablet Take 1 tablet (10 mg total) by mouth daily for 10 days. 30 tablet Gustavus Bryant, [place]  ? fluticasone (FLONASE) 50 MCG/ACT nasal spray Place 1 spray into both nostrils daily for 3 days. 16 g Gustavus Bryant, [place]  ? ?  ? ?PDMP not reviewed this encounter. ?  ?Gustavus Bryant, [place] ?04/15/21 1343 ? ?

## 2021-04-15 NOTE — Discharge Instructions (Signed)
Your rapid strep test was negative.  Throat culture, COVID-19, flu test is pending.  We will call if it is positive.  You have been prescribed 2 medications to alleviate symptoms.  Please follow-up if symptoms persist or worsen. ?

## 2021-04-16 LAB — COVID-19, FLU A+B NAA
Influenza A, NAA: NOT DETECTED
Influenza B, NAA: NOT DETECTED
SARS-CoV-2, NAA: NOT DETECTED

## 2021-04-17 LAB — CULTURE, GROUP A STREP (THRC)

## 2021-04-27 ENCOUNTER — Other Ambulatory Visit: Payer: Self-pay

## 2021-04-27 ENCOUNTER — Telehealth: Payer: Self-pay | Admitting: Physician Assistant

## 2021-04-27 ENCOUNTER — Encounter: Payer: Self-pay | Admitting: Physician Assistant

## 2021-04-27 ENCOUNTER — Ambulatory Visit: Payer: Medicaid Other | Attending: Physician Assistant | Admitting: Physician Assistant

## 2021-04-27 VITALS — BP 105/70 | HR 79 | Resp 16 | Ht 66.5 in | Wt 156.0 lb

## 2021-04-27 DIAGNOSIS — R630 Anorexia: Secondary | ICD-10-CM | POA: Diagnosis not present

## 2021-04-27 DIAGNOSIS — F411 Generalized anxiety disorder: Secondary | ICD-10-CM | POA: Diagnosis not present

## 2021-04-27 DIAGNOSIS — E559 Vitamin D deficiency, unspecified: Secondary | ICD-10-CM

## 2021-04-27 DIAGNOSIS — R634 Abnormal weight loss: Secondary | ICD-10-CM

## 2021-04-27 DIAGNOSIS — Z09 Encounter for follow-up examination after completed treatment for conditions other than malignant neoplasm: Secondary | ICD-10-CM | POA: Diagnosis not present

## 2021-04-27 NOTE — Progress Notes (Signed)
Patient ID: Rachael Tanner, female   DOB: 30-Mar-2000, 21 y.o.   MRN: [id number] ? ? ? ?Rachael Tanner, is a 21 y.o. female ? ?[id number] ? ?[id number] ? ?DOB - 03-Dec-2000 ? ?Chief Complaint  ?Patient presents with  ? Weight Loss  ?    ? ?Subjective:  ? ?Rachael Tanner is a 21 y.o. female here today for a follow up visit after  ?ED 04/15/2021 for URI.  Those symptoms have resolved.  She is continuing to be concerned about her weight and eating issues.  Her weight has fluctuated from 149lbs in 11/2019 to 171 lbs 05/2020; 168 02/2021 to 156 pounds today.  She says she feels as though ever since she first had covid a few years ago, she has worried about her health and her eating habits more.  She has had extensive lab testing.  Continues to complain of excessive worry over appetite and poor appetite and her weight.  No FH sudden death or early CA.  No N/V/D.  No hair loss, changes in skin.  She is not interested at this time for SSRI.  She is open after lengthy discussion to nutritional counseling and talking to someone for some counseling ? ? ?From ED A/P: ? ?Patient's symptoms appear viral in etiology.  Rapid strep was negative.  No signs of peritonsillar abscess on exam. Throat culture, COVID-19, flu test pending.  Patient sent prescriptions to help alleviate symptoms.  Discussed return precautions with patientatient has No headache, No chest pain, No abdominal pain - No Nausea, No new weakness tingling or numbness, No Cough - SOB. ? ?No problems updated. ? ?ALLERGIES: ?Not on File ? ?PAST MEDICAL HISTORY: ?Past Medical History:  ?Diagnosis Date  ? Anxiety   ? Anxiety   ? Asthma   ? COVID-19   ? Marijuana use   ? Palpitations   ? STD (female)   ? Tobacco abuse   ? ? ?MEDICATIONS AT HOME: ?Prior to Admission medications   ?Medication Sig Start Date End Date Taking? Authorizing Provider  ?albuterol (VENTOLIN HFA) 108 (90 Base) MCG/ACT inhaler Inhale 2 puffs into the lungs every 6 (six) hours as needed for wheezing or  shortness of breath. 05/24/20  Yes Marcine Matar, MD  ?Vitamin D, Ergocalciferol, (DRISDOL) 1.25 MG (50000 UNIT) CAPS capsule Take 1 capsule (50,000 Units total) by mouth every 7 (seven) days. 03/01/21  Yes Anders Simmonds, PA-C  ?cetirizine (ZYRTEC) 10 MG tablet Take 1 tablet (10 mg total) by mouth daily for 10 days. 04/15/21 04/25/21  Gustavus Bryant, FNP  ?famotidine (PEPCID) 20 MG tablet Take 1 tablet (20 mg total) by mouth 2 (two) times daily. ?Patient not taking: Reported on 11/30/2020 02/27/20   Wieters, Fran Lowes C, PA-C  ?fluticasone (FLONASE) 50 MCG/ACT nasal spray Place 1 spray into both nostrils daily for 3 days. 04/15/21 04/18/21  Gustavus Bryant, FNP  ? ? ?ROS: ?Neg HEENT ?Neg resp ?Neg cardiac ?Neg GI ?Neg GU ?Neg MS ?Neg psych ?Neg neuro ? ?Objective:  ? ?Vitals:  ? 04/27/21 1445  ?BP: 105/70  ?Pulse: 79  ?Resp: 16  ?SpO2: 96%  ?Weight: 156 lb (70.8 kg)  ?Height: 5' 6.5" (1.689 m)  ? ?Exam ?General appearance : Awake, alert, not in any distress. Speech Clear. Not toxic looking ?HEENT: Atraumatic and Normocephalic ?Neck: Supple, no JVD. No cervical lymphadenopathy.  ?Chest: Good air entry bilaterally, CTAB.  No rales/rhonchi/wheezing ?CVS: S1 S2 regular, no murmurs.  ?Extremities: B/L Lower Ext shows no edema, both legs  are warm to touch ?Neurology: Awake alert, and oriented X 3, CN II-XII intact, Non focal ?Skin: No Rash ? ?Data Review ?Lab Results  ?Component Value Date  ? HGBA1C 5.5 02/23/2021  ? HGBA1C 5.4 11/24/2019  ? ? ?Assessment & Plan  ? ?1. Generalized anxiety disorder ?Will have ASante McCoy reach out to her.  Consider trial SSRI ?Anxiety around health and food/eating.  Declines SSRI for now but has had extensive testing with nothing but minimal items to be addressed.  I think some coping mechanisms for anxiety and worrying about her health could be helpful ?- Thyroid Panel With TSH ?- Vitamin D, 25-hydroxy ? ?2. Unexplained weight loss ?Anxiety around health and food/eating.  Declines SSRI for  now but has had extensive testing with nothing but minimal items to be addressed.  I think some coping mechanisms for anxiety and worrying about her health could be helpful ?- Thyroid Panel With TSH ? ?3. Poor appetite ?- Thyroid Panel With TSH ?- Comprehensive metabolic panel ?- Referral to Nutrition and Diabetes Services ? ?4. Vitamin D deficiency ?- Thyroid Panel With TSH ?- Vitamin D, 25-hydroxy ? ?5. Encounter for examination following treatment at hospital ?Feeling better from URI ? ?Spent> 45 mins face to face about all of the above issues and reviewing labs that have been done previously with her ? ?Patient have been counseled extensively about nutrition and exercise. Other issues discussed during this visit include: low cholesterol diet, weight control and daily exercise, foot care, annual eye examinations at Ophthalmology, importance of adherence with medications and regular follow-up. We also discussed long term complications of uncontrolled diabetes and hypertension.  ? ?Return in about 2 months (around 06/27/2021) for to see me for weight loss/anxiety. ? ?The patient was given clear instructions to go to ER or return to medical center if symptoms don't improve, worsen or new problems develop. The patient verbalized understanding. The patient was told to call to get lab results if they haven't heard anything in the next week.  ? ? ? ? ?Georgian Co, PA-C ?[facility] ?[place], [place] ?[phone]   ?04/27/2021, 4:34 PM  ? ?

## 2021-04-28 LAB — COMPREHENSIVE METABOLIC PANEL
ALT: 19 IU/L (ref 0–32)
AST: 18 IU/L (ref 0–40)
Albumin/Globulin Ratio: 2.1 (ref 1.2–2.2)
Albumin: 4.5 g/dL (ref 3.9–5.0)
Alkaline Phosphatase: 91 IU/L (ref 44–121)
BUN/Creatinine Ratio: 11 (ref 9–23)
BUN: 9 mg/dL (ref 6–20)
Bilirubin Total: 0.3 mg/dL (ref 0.0–1.2)
CO2: 26 mmol/L (ref 20–29)
Calcium: 10 mg/dL (ref 8.7–10.2)
Chloride: 99 mmol/L (ref 96–106)
Creatinine, Ser: 0.83 mg/dL (ref 0.57–1.00)
Globulin, Total: 2.1 g/dL (ref 1.5–4.5)
Glucose: 85 mg/dL (ref 70–99)
Potassium: 4.4 mmol/L (ref 3.5–5.2)
Sodium: 141 mmol/L (ref 134–144)
Total Protein: 6.6 g/dL (ref 6.0–8.5)
eGFR: 102 mL/min/{1.73_m2} (ref >59)

## 2021-04-28 LAB — VITAMIN D 25 HYDROXY (VIT D DEFICIENCY, FRACTURES): Vit D, 25-Hydroxy: 60.4 ng/mL (ref 30.0–100.0)

## 2021-04-28 LAB — THYROID PANEL WITH TSH
Free Thyroxine Index: 2.1 (ref 1.2–4.9)
T3 Uptake Ratio: 28 % (ref 24–39)
T4, Total: 7.5 ug/dL (ref 4.5–12.0)
TSH: 0.964 u[IU]/mL (ref 0.450–4.500)

## 2021-05-17 DIAGNOSIS — Z7251 High risk heterosexual behavior: Secondary | ICD-10-CM | POA: Diagnosis not present

## 2021-05-17 DIAGNOSIS — Z1159 Encounter for screening for other viral diseases: Secondary | ICD-10-CM | POA: Diagnosis not present

## 2021-05-17 DIAGNOSIS — R140 Abdominal distension (gaseous): Secondary | ICD-10-CM | POA: Diagnosis not present

## 2021-05-17 DIAGNOSIS — R634 Abnormal weight loss: Secondary | ICD-10-CM | POA: Diagnosis not present

## 2021-05-17 DIAGNOSIS — Z114 Encounter for screening for human immunodeficiency virus [HIV]: Secondary | ICD-10-CM | POA: Diagnosis not present

## 2021-05-22 ENCOUNTER — Emergency Department (HOSPITAL_BASED_OUTPATIENT_CLINIC_OR_DEPARTMENT_OTHER)
Admission: EM | Admit: 2021-05-22 | Discharge: 2021-05-22 | Disposition: A | Discharge status: Home / Self Care | Payer: Medicaid Other | Attending: Emergency Medicine | Admitting: Emergency Medicine

## 2021-05-22 ENCOUNTER — Other Ambulatory Visit: Payer: Self-pay

## 2021-05-22 ENCOUNTER — Encounter (HOSPITAL_BASED_OUTPATIENT_CLINIC_OR_DEPARTMENT_OTHER): Payer: Self-pay | Admitting: Emergency Medicine

## 2021-05-22 DIAGNOSIS — R5383 Other fatigue: Secondary | ICD-10-CM | POA: Diagnosis not present

## 2021-05-22 LAB — COMPREHENSIVE METABOLIC PANEL
ALT: 15 U/L (ref 0–44)
AST: 18 U/L (ref 15–41)
Albumin: 4.5 g/dL (ref 3.5–5.0)
Alkaline Phosphatase: 74 U/L (ref 38–126)
Anion gap: 8 (ref 5–15)
BUN: 8 mg/dL (ref 6–20)
CO2: 26 mmol/L (ref 22–32)
Calcium: 9.7 mg/dL (ref 8.9–10.3)
Chloride: 103 mmol/L (ref 98–111)
Creatinine, Ser: 0.73 mg/dL (ref 0.44–1.00)
GFR, Estimated: >60 mL/min (ref >60)
Glucose, Bld: 95 mg/dL (ref 70–99)
Potassium: 3.9 mmol/L (ref 3.5–5.1)
Sodium: 137 mmol/L (ref 135–145)
Total Bilirubin: 0.8 mg/dL (ref 0.3–1.2)
Total Protein: 7.6 g/dL (ref 6.5–8.1)

## 2021-05-22 LAB — CBC WITH DIFFERENTIAL/PLATELET
Abs Immature Granulocytes: 0.01 10*3/uL (ref 0.00–0.07)
Basophils Absolute: 0 10*3/uL (ref 0.0–0.1)
Basophils Relative: 0 %
Eosinophils Absolute: 0.1 10*3/uL (ref 0.0–0.5)
Eosinophils Relative: 3 %
HCT: 44.5 % (ref 36.0–46.0)
Hemoglobin: 14.4 g/dL (ref 12.0–15.0)
Immature Granulocytes: 0 %
Lymphocytes Relative: 33 %
Lymphs Abs: 1.2 10*3/uL (ref 0.7–4.0)
MCH: 27.2 pg (ref 26.0–34.0)
MCHC: 32.4 g/dL (ref 30.0–36.0)
MCV: 84.1 fL (ref 80.0–100.0)
Monocytes Absolute: 0.4 10*3/uL (ref 0.1–1.0)
Monocytes Relative: 11 %
Neutro Abs: 2 10*3/uL (ref 1.7–7.7)
Neutrophils Relative %: 53 %
Platelets: 216 10*3/uL (ref 150–400)
RBC: 5.29 MIL/uL — ABNORMAL HIGH (ref 3.87–5.11)
RDW: 14.3 % (ref 11.5–15.5)
WBC: 3.7 10*3/uL — ABNORMAL LOW (ref 4.0–10.5)
nRBC: 0 % (ref 0.0–0.2)

## 2021-05-22 NOTE — ED Notes (Signed)
Pt verbalizes understanding of discharge instructions. Opportunity for questioning and answers were provided. Pt discharged from ED to home.   ? ?

## 2021-05-22 NOTE — ED Provider Triage Note (Signed)
Emergency Medicine Provider Triage Evaluation Note ? ?Rachael Tanner , a 21 y.o. female  was evaluated in triage.  Pt complains of fatigue and weight loss. States that she recently moved here and has been more stressed than normal. States that over the past 2 months she has not had any energy and has lost approximately 30 lbs. States that she was 183 lbs and is now 150 lbs. She states that she has not been eating nearly as much as she used to and has been more active than normal. She denies any pain, fevers, chills, night sweats, shortness of breath. She is currently on her menstrual cycle. ? ?Review of Systems  ?Positive:  ?Negative:  ? ?Physical Exam  ?BP 115/74 (BP Location: Right Arm)   Pulse 77   Temp 97.8 ?F (36.6 ?C)   Resp 16   LMP 04/22/2021   SpO2 98%  ?Gen:   Awake, no distress   ?Resp:  Normal effort  ?MSK:   Moves extremities without difficulty  ?Other:   ? ?Medical Decision Making  ?Medically screening exam initiated at 4:36 PM.  Appropriate orders placed.  Rachael Tanner was informed that the remainder of the evaluation will be completed by another provider, this initial triage assessment does not replace that evaluation, and the importance of remaining in the ED until their evaluation is complete. ? ? ?  ?Bud Face, PA-C ?05/22/21 1640 ? ?

## 2021-05-22 NOTE — Discharge Instructions (Addendum)
As we discussed, your work-up in the ER was reassuring for acute abnormalities.  I recommend following up with your primary care doctor for further evaluation and management of your symptoms.  In the interim, you may take a daily multivitamin to help with your fatigue.  You can get this over-the-counter. ? ?Return if development of any new or worsening symptoms. ?

## 2021-05-22 NOTE — ED Provider Notes (Signed)
?[facility] ?Provider Note ? ? ?CSN: [id number] ?Arrival date & time: 05/22/21  1532 ? ?  ? ?History ? ?Chief Complaint  ?Patient presents with  ? Fatigue  ? ? ?Rachael Tanner is a 21 y.o. female. ? ?Patient with no pertinent past medical history presents today with complaints of fatigue. She states that same has been ongoing for the past 2 months since she recently moved here. States that since the move she has been feeling more isolated with not knowing anyone here and being more stressed overall. She states that she has not been eating normally over the past 2 months and has been move active with her new job. States that during that time she has lost approximately 30 lbs going from 183 lbs to 150 lbs. States that she has not had much of an appetite during this time either. Denies any fevers, chills, night sweats, chest pain, shortness of breath, nausea, vomiting, or diarrhea. ? ?The history is provided by the patient. No language interpreter was used.  ? ?  ? ?Home Medications ?Prior to Admission medications   ?Medication Sig Start Date End Date Taking? Authorizing Provider  ?albuterol (VENTOLIN HFA) 108 (90 Base) MCG/ACT inhaler Inhale 2 puffs into the lungs every 6 (six) hours as needed for wheezing or shortness of breath. 05/24/20   Ladell Pier, MD  ?cetirizine (ZYRTEC) 10 MG tablet Take 1 tablet (10 mg total) by mouth daily for 10 days. 04/15/21 04/25/21  Teodora Medici, FNP  ?famotidine (PEPCID) 20 MG tablet Take 1 tablet (20 mg total) by mouth 2 (two) times daily. ?Patient not taking: Reported on 11/30/2020 02/27/20   Wieters, Madelynn Done C, PA-C  ?fluticasone (FLONASE) 50 MCG/ACT nasal spray Place 1 spray into both nostrils daily for 3 days. 04/15/21 04/18/21  Teodora Medici, FNP  ?Vitamin D, Ergocalciferol, (DRISDOL) 1.25 MG (50000 UNIT) CAPS capsule Take 1 capsule (50,000 Units total) by mouth every 7 (seven) days. 03/01/21   Argentina Donovan, PA-C  ?   ? ?Allergies    ?Patient  has no allergy information on record.   ? ?Review of Systems   ?Review of Systems  ?Constitutional:  Negative for chills and fever.  ?Respiratory:  Negative for shortness of breath.   ?Cardiovascular:  Negative for chest pain.  ?Gastrointestinal:  Negative for abdominal pain.  ?Psychiatric/Behavioral:  Negative for suicidal ideas.   ?All other systems reviewed and are negative. ? ?Physical Exam ?Updated Vital Signs ?BP 118/77   Pulse 84   Temp 97.8 ?F (36.6 ?C)   Resp 17   LMP 04/22/2021   SpO2 99%  ?Physical Exam ?Vitals and nursing note reviewed.  ?Constitutional:   ?   General: She is not in acute distress. ?   Appearance: Normal appearance. She is normal weight. She is not ill-appearing, toxic-appearing or diaphoretic.  ?HENT:  ?   Head: Normocephalic and atraumatic.  ?Cardiovascular:  ?   Rate and Rhythm: Normal rate and regular rhythm.  ?   Heart sounds: Normal heart sounds.  ?Pulmonary:  ?   Effort: Pulmonary effort is normal. No respiratory distress.  ?   Breath sounds: Normal breath sounds.  ?Abdominal:  ?   General: Abdomen is flat.  ?   Palpations: Abdomen is soft.  ?   Tenderness: There is no abdominal tenderness. There is no right CVA tenderness or left CVA tenderness.  ?Musculoskeletal:     ?   General: Normal range of motion.  ?  Cervical back: Normal range of motion.  ?Skin: ?   General: Skin is warm and dry.  ?Neurological:  ?   General: No focal deficit present.  ?   Mental Status: She is alert.  ?Psychiatric:     ?   Mood and Affect: Mood normal.     ?   Behavior: Behavior normal.  ? ? ?ED Results / Procedures / Treatments   ?Labs ?(all labs ordered are listed, but only abnormal results are displayed) ?Labs Reviewed  ?CBC WITH DIFFERENTIAL/PLATELET - Abnormal; Notable for the following components:  ?    Result Value  ? WBC 3.7 (*)   ? RBC 5.29 (*)   ? All other components within normal limits  ?COMPREHENSIVE METABOLIC PANEL  ? ? ?EKG ?None ? ?Radiology ?No results  found. ? ?Procedures ?Procedures  ? ? ?Medications Ordered in ED ?Medications - No data to display ? ?ED Course/ Medical Decision Making/ A&P ?  ?                        ?Medical Decision Making ?Amount and/or Complexity of Data Reviewed ?Labs: ordered. ? ? ?Patient presents today with concerns for fatigue over the past 2 months with 30 lb weight loss. She is afebrile, non-toxic appearing, and in no acute distress with reassuring vital signs. Labs without leukocytosis or anemia.  No electrolyte abnormalities or changes in kidney or liver function.  Extensive physical exam performed without acute abnormalities.  Patient does state that she has been eating significantly less than normal and exercising more than normal as well.  Suspect that this explains her fatigue as well as her weight loss.  No other red flag symptoms endorsed by the patient.  In someone who is young and healthy with no comorbid factors, no further emergent concerns at this time.  She does endorse increased stress and some mental health concerns without SI/HI.  Suspect this is the etiology of the patient's symptoms.  Will recommend close PCP follow-up with return precautions.  Patient understanding and amenable with plan.  Educated on red flag symptoms or prompt immediate return.  Discharged in stable condition. ? ? ?Final Clinical Impression(s) / ED Diagnoses ?Final diagnoses:  ?Fatigue, unspecified type  ? ? ?Rx / DC Orders ?ED Discharge Orders   ? ? None  ? ?  ?An After Visit Summary was printed and given to the patient. ? ? ?  ?Bud Face, PA-C ?05/22/21 2317 ? ?  ?Lianne Cure, DO ?[id number] ? ?

## 2021-05-22 NOTE — ED Triage Notes (Addendum)
Pt reports fatigue x 2 days. Denies N/V/D. Denies fevers. Reports decreased PO intake. Pt reports weight loss of 30lbs in last 2 months. Pt stating "I may be depressed I don't know." ?

## 2021-05-23 ENCOUNTER — Telehealth: Payer: Self-pay

## 2021-05-23 NOTE — Telephone Encounter (Signed)
Transition Care Management Follow-up Telephone Call ?Date of discharge and from where: 05/22/2021-[facility]  ?How have you been since you were released from the hospital? Pt stated she is doing fine.  ?Any questions or concerns? No ? ?Items Reviewed: ?Did the pt receive and understand the discharge instructions provided? Yes  ?Medications obtained and verified?  No medications given at discharge ?Other? No  ?Any new allergies since your discharge? No  ?Dietary orders reviewed? No ?Do you have support at home? Yes  ? ?Home Care and Equipment/Supplies: ?Were home health services ordered? not applicable ?If so, what is the name of the agency? N/A  ?Has the agency set up a time to come to the patient's home? not applicable ?Were any new equipment or medical supplies ordered?  No ?What is the name of the medical supply agency? N/A ?Were you able to get the supplies/equipment? not applicable ?Do you have any questions related to the use of the equipment or supplies? No ? ?Functional Questionnaire: (I = Independent and D = Dependent) ?ADLs: I ? ?Bathing/Dressing- I ? ?Meal Prep- I ? ?Eating- I ? ?Maintaining continence- I ? ?Transferring/Ambulation- I ? ?Managing Meds- I ? ?Follow up appointments reviewed: ? ?PCP Hospital f/u appt confirmed? No  . ?Specialist Hospital f/u appt confirmed? No   ?Are transportation arrangements needed? No  ?If their condition worsens, is the pt aware to call PCP or go to the Emergency Dept.? Yes ?Was the patient provided with contact information for the PCP's office or ED? Yes ?Was to pt encouraged to call back with questions or concerns? Yes  ?

## 2021-06-01 NOTE — Telephone Encounter (Signed)
I attempted to call pt twice, no answer, unable to leave vm. I will send pt counseling resources in the mail and to mychart.  ?

## 2021-06-01 NOTE — Telephone Encounter (Signed)
Pt returned call, please advise  

## 2021-06-15 ENCOUNTER — Telehealth: Payer: Self-pay | Admitting: Internal Medicine

## 2021-06-15 DIAGNOSIS — K047 Periapical abscess without sinus: Secondary | ICD-10-CM | POA: Diagnosis not present

## 2021-06-15 DIAGNOSIS — K0889 Other specified disorders of teeth and supporting structures: Secondary | ICD-10-CM

## 2021-06-15 NOTE — Telephone Encounter (Signed)
Copied from CRM [id number]. Topic: Referral - Request for Referral ?>> Jun 15, 2021  9:09 AM Jaquita Rector A wrote: ?Has patient seen PCP for this complaint? No. ?*If NO, is insurance requiring patient see PCP for this issue before PCP can refer them? ?Referral for which specialty: Urgent Dental  ?Preferred provider/office: any where [place]  ?Reason for referral: Wisdom tooth need to be taken out in lots of pain ?

## 2021-06-15 NOTE — Telephone Encounter (Signed)
Will forward to provider  

## 2021-06-20 ENCOUNTER — Ambulatory Visit
Admission: EM | Admit: 2021-06-20 | Discharge: 2021-06-20 | Disposition: A | Discharge status: Home / Self Care | Payer: Medicaid Other | Attending: Emergency Medicine | Admitting: Emergency Medicine

## 2021-06-20 ENCOUNTER — Encounter: Payer: Medicaid Other | Attending: Physician Assistant | Admitting: Registered"

## 2021-06-20 DIAGNOSIS — N898 Other specified noninflammatory disorders of vagina: Secondary | ICD-10-CM | POA: Diagnosis not present

## 2021-06-20 NOTE — Discharge Instructions (Addendum)
We will notify you if any of your results are positive. ? ?Please return to the urgent care or emergency department if symptoms worsen or do not improve. ? ?

## 2021-06-20 NOTE — ED Provider Notes (Signed)
?Rachael Tanner ? ? ? ?CSN: [id number] ?Arrival date & time: 06/20/21  1302 ? ? ?  ? ?History   ?Chief Complaint ?Chief Complaint  ?Patient presents with  ? Vaginal Discharge  ? ? ?HPI ?Rachael Tanner is a 21 y.o. female.  ?Presents today with 3 day history of vaginal irritation. States itching in vagina, increased amount of discharge. Denies urinary symptoms. Hx of BV but doesn't remember what it felt like. ?LMP 5/8, no known STD exposure. ?Denies fever, abdominal pain, vomiting/diarrhea, flank pain. ? ?Past Medical History:  ?Diagnosis Date  ? Anxiety   ? Anxiety   ? Asthma   ? COVID-19   ? Marijuana use   ? Palpitations   ? STD (female)   ? Tobacco abuse   ? ? ?Patient Active Problem List  ? Diagnosis Date Noted  ? Polycythemia 03/29/2020  ? Smoker 03/29/2020  ? PVC (premature ventricular contraction) 02/18/2020  ? Non-cardiac chest pain 11/25/2019  ? Palpitations 11/25/2019  ? Shortness of breath 11/25/2019  ? ? ?History reviewed. No pertinent surgical history. ? ?OB History   ?No obstetric history on file. ?  ? ? ? ?Home Medications   ? ?Prior to Admission medications   ?Medication Sig Start Date End Date Taking? Authorizing Provider  ?albuterol (VENTOLIN HFA) 108 (90 Base) MCG/ACT inhaler Inhale 2 puffs into the lungs every 6 (six) hours as needed for wheezing or shortness of breath. 05/24/20   Ladell Pier, MD  ?amoxicillin (AMOXIL) 500 MG tablet Take 500 mg by mouth 3 (three) times daily. 06/15/21   [provider]  ? ? ?Family History ?Family History  ?Problem Relation Age of Onset  ? Hypertension Mother   ? ? ?Social History ?Social History  ? ?Tobacco Use  ? Smoking status: Some Days  ?  Types: Cigars  ? Smokeless tobacco: Never  ?Vaping Use  ? Vaping Use: Never used  ?Substance Use Topics  ? Alcohol use: Yes  ?  Comment: "weekends"  ? Drug use: Not Currently  ?  Types: Marijuana  ?  Comment: last use May '21  ? ? ? ?Allergies   ?Patient has no known allergies. ? ? ?Review of  Systems ?Review of Systems  ?Genitourinary:  Positive for vaginal discharge.  ? ?As per HPI ? ?Physical Exam ?Triage Vital Signs ?ED Triage Vitals [06/20/21 1443]  ?Enc Vitals Group  ?   BP 119/83  ?   Pulse Rate 72  ?   Resp 18  ?   Temp 98.3 ?F (36.8 ?C)  ?   Temp Source Oral  ?   SpO2 96 %  ?   Weight   ?   Height   ?   Head Circumference   ?   Peak Flow   ?   Pain Score 0  ?   Pain Loc   ?   Pain Edu?   ?   Excl. in [place]?   ? ?No data found. ? ?Updated Vital Signs ?BP 119/83 (BP Location: Left Arm)   Pulse 72   Temp 98.3 ?F (36.8 ?C) (Oral)   Resp 18   LMP 06/12/2021 (Exact Date)   SpO2 96%  ? ?Physical Exam ?Vitals and nursing note reviewed.  ?Constitutional:   ?   General: She is not in acute distress. ?   Appearance: She is well-developed.  ?Eyes:  ?   Conjunctiva/sclera: Conjunctivae normal.  ?Cardiovascular:  ?   Rate and Rhythm: Normal rate and regular  rhythm.  ?   Heart sounds: Normal heart sounds.  ?Pulmonary:  ?   Effort: Pulmonary effort is normal. No respiratory distress.  ?   Breath sounds: Normal breath sounds.  ?Abdominal:  ?   Palpations: Abdomen is soft.  ?   Tenderness: There is no abdominal tenderness.  ?Genitourinary: ?   Comments: Deferred ?Musculoskeletal:  ?   Cervical back: Neck supple.  ?Neurological:  ?   Mental Status: She is alert.  ?Psychiatric:     ?   Mood and Affect: Mood normal.  ? ? ?UC Treatments / Results  ?Labs ?(all labs ordered are listed, but only abnormal results are displayed) ?Labs Reviewed  ?CERVICOVAGINAL ANCILLARY ONLY  ? ? ?EKG ? ?Radiology ?No results found. ? ?Procedures ?Procedures (including critical care time) ? ?Medications Ordered in UC ?Medications - No data to display ? ?Initial Impression / Assessment and Plan / UC Course  ?I have reviewed the triage vital signs and the nursing notes. ? ?Pertinent labs & imaging results that were available during my care of the patient were reviewed by me and considered in my medical decision making (see chart for  details). ?  ?Discussed with patient possibility for many different things that can cause irritation. Patient has declined pelvic exam today and would like to wait for her results instead. Cytology is pending and patient understands she will be notified if any of her results are positive. Physical exam does not reveal any abdominal or pelvic tenderness, no flank pain. ?Return precautions discussed. Patient agrees to plan and is discharged in stable condition. ? ?Final Clinical Impressions(s) / UC Diagnoses  ? ?Final diagnoses:  ?Vaginal irritation  ? ? ? ?Discharge Instructions   ? ?  ?We will notify you if any of your results are positive. ? ?Please return to the urgent care or emergency department if symptoms worsen or do not improve. ? ? ? ? ?ED Prescriptions   ?None ?  ? ?PDMP not reviewed this encounter. ?  ?Myalynn Lingle, Wells Guiles, PA-C ?06/20/21 1616 ? ?

## 2021-06-20 NOTE — ED Triage Notes (Signed)
2 day h/o vaginal discharge and irritation. Denies odor and itching. No meds taken. No known std exposure. ?

## 2021-06-21 ENCOUNTER — Telehealth (HOSPITAL_COMMUNITY): Payer: Self-pay | Admitting: Emergency Medicine

## 2021-06-21 LAB — CERVICOVAGINAL ANCILLARY ONLY
Bacterial Vaginitis (gardnerella): POSITIVE — AB
Candida Glabrata: NEGATIVE
Candida Vaginitis: POSITIVE — AB
Chlamydia: NEGATIVE
Comment: NEGATIVE
Comment: NEGATIVE
Comment: NEGATIVE
Comment: NEGATIVE
Comment: NEGATIVE
Comment: NORMAL
Neisseria Gonorrhea: NEGATIVE
Trichomonas: NEGATIVE

## 2021-06-21 MED ORDER — FLUCONAZOLE 150 MG PO TABS: 150.0000 mg | ORAL_TABLET | Freq: Once | ORAL | 0 refills | Status: AC

## 2021-06-21 MED ORDER — METRONIDAZOLE 500 MG PO TABS: 500.0000 mg | ORAL_TABLET | Freq: Two times a day (BID) | ORAL | 0 refills | Status: DC

## 2021-07-02 ENCOUNTER — Encounter (HOSPITAL_BASED_OUTPATIENT_CLINIC_OR_DEPARTMENT_OTHER): Payer: Self-pay

## 2021-07-02 ENCOUNTER — Emergency Department (HOSPITAL_BASED_OUTPATIENT_CLINIC_OR_DEPARTMENT_OTHER)
Admission: EM | Admit: 2021-07-02 | Discharge: 2021-07-02 | Disposition: A | Discharge status: Home / Self Care | Payer: Medicaid Other | Attending: Emergency Medicine | Admitting: Emergency Medicine

## 2021-07-02 ENCOUNTER — Other Ambulatory Visit: Payer: Self-pay

## 2021-07-02 DIAGNOSIS — R410 Disorientation, unspecified: Secondary | ICD-10-CM | POA: Diagnosis present

## 2021-07-02 DIAGNOSIS — F1190 Opioid use, unspecified, uncomplicated: Secondary | ICD-10-CM | POA: Insufficient documentation

## 2021-07-02 LAB — CBG MONITORING, ED: Glucose-Capillary: 97 mg/dL (ref 70–99)

## 2021-07-02 NOTE — ED Notes (Signed)
Dc instructions reviewed with pt. Pt advised to use caution while taking opioids. No questions or concerns at this time. Will follow up if needed.

## 2021-07-02 NOTE — ED Triage Notes (Signed)
Took a hydrocodone 3 hours prior to arrival after having wisdom tooth removed. Sts she feels disoriented after taking. Alert, oriented, ambulatory and drove self to ER .

## 2021-07-02 NOTE — ED Provider Notes (Addendum)
[facility] EMERGENCY DEPT Provider Note   CSN: [id number] Arrival date & time: 07/02/21  2220     History  No chief complaint on file.   Rachael Tanner is a 21 y.o. female.  HPI  21 year old female presenting to the emergency department with a transient episode of confusion after taking a single hydrocodone.  The patient states that she has been on hydrocodone for the last 2 weeks after being diagnosed with an odontogenic infection.  She has been taking the medication regularly and has also been on antibiotics.  She states her odontogenic infection has been improving and is almost completely resolved.  She denies any headaches or difficulty opening or closing her mouth.  She denies any significant dental pain.  She states that after taking her opiate earlier today, she felt transiently confused and was worried she could have overdosed.  She felt like her respirations were slightly decreased.  She drove herself to the emergency department for further evaluation.  On arrival, the patient was alert and oriented x3, no respiratory depression noted, vitally stable.  Home Medications Prior to Admission medications   Medication Sig Start Date End Date Taking? Authorizing Provider  albuterol (VENTOLIN HFA) 108 (90 Base) MCG/ACT inhaler Inhale 2 puffs into the lungs every 6 (six) hours as needed for wheezing or shortness of breath. 05/24/20   Marcine Matar, MD  amoxicillin (AMOXIL) 500 MG tablet Take 500 mg by mouth 3 (three) times daily. 06/15/21   [provider]  metroNIDAZOLE (FLAGYL) 500 MG tablet Take 1 tablet (500 mg total) by mouth 2 (two) times daily. 06/21/21   Lamptey, Britta Mccreedy, MD      Allergies    Patient has no known allergies.    Review of Systems   Review of Systems  All other systems reviewed and are negative.  Physical Exam Updated Vital Signs BP 118/73 (BP Location: Right Arm)   Pulse 70   Temp 98.2 F (36.8 C) (Oral)   Resp 16   Ht 5\' 6"   (1.676 m)   Wt 68 kg   LMP 06/12/2021 (Exact Date)   SpO2 100%   BMI 24.20 kg/m  Physical Exam Vitals and nursing note reviewed.  Constitutional:      General: She is not in acute distress.    Appearance: She is well-developed.  HENT:     Head: Normocephalic and atraumatic.     Comments: Oropharynx clear, no significant trismus, no evidence for current odontogenic abscess. Eyes:     Conjunctiva/sclera: Conjunctivae normal.  Pulmonary:     Effort: Pulmonary effort is normal. No respiratory distress.  Abdominal:     Palpations: Abdomen is soft.     Tenderness: There is no abdominal tenderness.  Musculoskeletal:        General: No swelling.     Cervical back: Neck supple.  Skin:    General: Skin is warm and dry.     Capillary Refill: Capillary refill takes less than 2 seconds.  Neurological:     Mental Status: She is alert and oriented to person, place, and time. Mental status is at baseline.     Cranial Nerves: No cranial nerve deficit.     Sensory: No sensory deficit.     Motor: No weakness.     Coordination: Coordination normal.     Gait: Gait normal.  Psychiatric:        Mood and Affect: Mood normal.    ED Results / Procedures / Treatments  Labs (all labs ordered are listed, but only abnormal results are displayed) Labs Reviewed  CBG MONITORING, ED    EKG None  Radiology No results found.  Procedures Procedures    Medications Ordered in ED Medications - No data to display  ED Course/ Medical Decision Making/ A&P                           Medical Decision Making   21 year old female presenting to the emergency department with a transient episode of confusion after taking a single hydrocodone.  The patient states that she has been on hydrocodone for the last 2 weeks after being diagnosed with an odontogenic infection.  She has been taking the medication regularly and has also been on antibiotics.  She states her odontogenic infection has been improving  and is almost completely resolved.  She denies any headaches or difficulty opening or closing her mouth.  She denies any significant dental pain.  She states that after taking her opiate earlier today, she felt transiently confused and was worried she could have overdosed.  She felt like her respirations were slightly decreased.  She drove herself to the emergency department for further evaluation.  On arrival, the patient was alert and oriented x3, no respiratory depression noted, vitally stable.  On arrival, patient vitally stable.  Respiratory rate 16 on arrival.  Low concern for acute opiate toxicity at this time.  Alert and oriented x3 with a normal neurologic exam.  Patient's oropharynx was evaluated with no current evidence for continued odontogenic infection.  Patient presenting with a transient episode of confusion after taking a single Norco 3 hours prior to arrival.  She is currently alert and oriented with a normal neurologic exam, not somnolent, ambulatory and not with any significant respiratory depression or alteration in her mental status.  I counseled the patient against driving while utilizing opiates or any other substances.  A CBG was checked and was normal.  Overall at this time feel that the patient is stable for discharge with continued outpatient management.  The patient has been appropriately medically screened and/or stabilized in the ED. I have low suspicion for any other emergent medical condition which would require further screening, evaluation or treatment in the ED or require inpatient management.  Final Clinical Impression(s) / ED Diagnoses Final diagnoses:  Confusion  Opiate use    Rx / DC Orders ED Discharge Orders     None         Ernie Avena, MD 07/02/21 2308    Ernie Avena, MD 07/02/21 2308

## 2021-07-04 ENCOUNTER — Telehealth: Payer: Self-pay

## 2021-07-04 NOTE — Telephone Encounter (Signed)
Transition Care Management Follow-up Telephone Call Date of discharge and from where: 07/03/2021[facility] How have you been since you were released from the hospital? Pt stated she is doing fine.  Any questions or concerns? No  Items Reviewed: Did the pt receive and understand the discharge instructions provided? Yes  Medications obtained and verified?  No medications given at discharge  Other? No  Any new allergies since your discharge? No  Dietary orders reviewed? No Do you have support at home? Yes   Home Care and Equipment/Supplies: Were home health services ordered? not applicable If so, what is the name of the agency? N/A  Has the agency set up a time to come to the patient's home? not applicable Were any new equipment or medical supplies ordered?  No What is the name of the medical supply agency? N/A Were you able to get the supplies/equipment? not applicable Do you have any questions related to the use of the equipment or supplies? No  Functional Questionnaire: (I = Independent and D = Dependent) ADLs: I  Bathing/Dressing- I  Meal Prep- I  Eating- I  Maintaining continence- I  Transferring/Ambulation- I  Managing Meds- I  Follow up appointments reviewed:  PCP Hospital f/u appt confirmed? No   Specialist Hospital f/u appt confirmed? No   Transportation needed? No  If their condition worsens, is the pt aware to call PCP or go to the Emergency Dept.? Yes Was the patient provided with contact information for the PCP's office or ED? Yes Was to pt encouraged to call back with questions or concerns? Yes

## 2021-09-12 ENCOUNTER — Ambulatory Visit: Payer: Medicaid Other | Admitting: Internal Medicine

## 2021-09-15 ENCOUNTER — Ambulatory Visit: Payer: Medicaid Other | Admitting: Internal Medicine

## 2021-09-21 DIAGNOSIS — R7303 Prediabetes: Secondary | ICD-10-CM | POA: Diagnosis not present

## 2021-09-21 DIAGNOSIS — N760 Acute vaginitis: Secondary | ICD-10-CM | POA: Diagnosis not present

## 2021-09-21 DIAGNOSIS — Z7251 High risk heterosexual behavior: Secondary | ICD-10-CM | POA: Diagnosis not present

## 2021-09-21 DIAGNOSIS — Z0130 Encounter for examination of blood pressure without abnormal findings: Secondary | ICD-10-CM | POA: Diagnosis not present

## 2021-09-21 DIAGNOSIS — R634 Abnormal weight loss: Secondary | ICD-10-CM | POA: Diagnosis not present

## 2021-09-21 DIAGNOSIS — Z114 Encounter for screening for human immunodeficiency virus [HIV]: Secondary | ICD-10-CM | POA: Diagnosis not present

## 2021-09-27 DIAGNOSIS — A749 Chlamydial infection, unspecified: Secondary | ICD-10-CM | POA: Diagnosis not present

## 2021-09-27 DIAGNOSIS — Z0130 Encounter for examination of blood pressure without abnormal findings: Secondary | ICD-10-CM | POA: Diagnosis not present

## 2021-09-27 DIAGNOSIS — A549 Gonococcal infection, unspecified: Secondary | ICD-10-CM | POA: Diagnosis not present

## 2021-10-17 ENCOUNTER — Emergency Department (HOSPITAL_BASED_OUTPATIENT_CLINIC_OR_DEPARTMENT_OTHER)
Admission: EM | Admit: 2021-10-17 | Discharge: 2021-10-17 | Disposition: A | Discharge status: Home / Self Care | Payer: Medicaid Other | Attending: Emergency Medicine | Admitting: Emergency Medicine

## 2021-10-17 ENCOUNTER — Other Ambulatory Visit: Payer: Self-pay

## 2021-10-17 ENCOUNTER — Encounter (HOSPITAL_BASED_OUTPATIENT_CLINIC_OR_DEPARTMENT_OTHER): Payer: Self-pay

## 2021-10-17 DIAGNOSIS — R1310 Dysphagia, unspecified: Secondary | ICD-10-CM | POA: Diagnosis not present

## 2021-10-17 DIAGNOSIS — R0602 Shortness of breath: Secondary | ICD-10-CM | POA: Diagnosis present

## 2021-10-17 DIAGNOSIS — Z20822 Contact with and (suspected) exposure to covid-19: Secondary | ICD-10-CM | POA: Diagnosis not present

## 2021-10-17 LAB — PREGNANCY, URINE: Preg Test, Ur: NEGATIVE

## 2021-10-17 LAB — SARS CORONAVIRUS 2 BY RT PCR: SARS Coronavirus 2 by RT PCR: NEGATIVE

## 2021-10-17 LAB — GROUP A STREP BY PCR: Group A Strep by PCR: NOT DETECTED

## 2021-10-17 MED ORDER — DEXAMETHASONE 4 MG PO TABS
10.0000 mg | ORAL_TABLET | Freq: Once | ORAL | Status: AC
  Administered 2021-10-17: 10 mg via ORAL
  Filled 2021-10-17: qty 3

## 2021-10-17 NOTE — ED Triage Notes (Signed)
Reports waking up from sleep with mild shob and "throat pressure".   Denies sore throat but says its more like having to work to swallow.

## 2021-10-17 NOTE — ED Notes (Signed)
Pt able to swallow medication and water without difficulty. Pt also given popcorn and ginger ale and able to eat and drink without difficulty.

## 2021-10-17 NOTE — ED Provider Notes (Signed)
[facility] EMERGENCY DEPT Provider Note   CSN: [id number] Arrival date & time: 10/17/21  0125     History  Chief Complaint  Patient presents with   Shortness of Breath    Rachael Tanner is a 21 y.o. female.  The history is provided by the patient.  Shortness of Breath She woke up tonight with a sense that her throat was closing and she was having difficulty swallowing.  It was not painful to swallow, but she was able to force herself to swallow.  She denies any sore throat denies fever or chills.  She also has had a slight tickle in her throat which caused a cough.  This has been present for the last 2 days.  She is concerned he might had COVID because she did have similar symptoms when he had COVID in the past and she has exposure to a lot of people with her job with [employer], and her sister also had been at a party with people who had COVID.   Home Medications Prior to Admission medications   Medication Sig Start Date End Date Taking? Authorizing Provider  albuterol (VENTOLIN HFA) 108 (90 Base) MCG/ACT inhaler Inhale 2 puffs into the lungs every 6 (six) hours as needed for wheezing or shortness of breath. 05/24/20   Marcine Matar, MD  amoxicillin (AMOXIL) 500 MG tablet Take 500 mg by mouth 3 (three) times daily. 06/15/21   [provider]  metroNIDAZOLE (FLAGYL) 500 MG tablet Take 1 tablet (500 mg total) by mouth 2 (two) times daily. 06/21/21   Lamptey, Britta Mccreedy, MD      Allergies    Patient has no known allergies.    Review of Systems   Review of Systems  Respiratory:  Positive for shortness of breath.   All other systems reviewed and are negative.   Physical Exam Updated Vital Signs BP (!) 136/99   Pulse (!) 51   Temp 98.1 F (36.7 C) (Oral)   Resp 18   Ht 5\' 6"  (1.676 m)   Wt 67.9 kg   SpO2 100%   BMI 24.16 kg/m  Physical Exam Vitals and nursing note reviewed.   21 year old female, resting comfortably and in no acute distress.  Vital signs are significant for elevated blood pressure and slow heart rate. Oxygen saturation is 100%, which is normal. Head is normocephalic and atraumatic. PERRLA, EOMI. Oropharynx shows very mild edema of the uvula but no pharyngeal erythema and no tonsillar hypertrophy.  There is no pooling of secretions and phonation is normal. Neck is nontender and supple without adenopathy. Back is nontender and there is no CVA tenderness. Lungs are clear without rales, wheezes, or rhonchi. Chest is nontender. Heart has regular rate and rhythm without murmur. Abdomen is soft, flat, nontender. Extremities have no cyanosis or edema, full range of motion is present. Skin is warm and dry without rash. Neurologic: Mental status is normal, cranial nerves are intact, moves all extremities equally.  ED Results / Procedures / Treatments   Labs (all labs ordered are listed, but only abnormal results are displayed) Labs Reviewed  SARS CORONAVIRUS 2 BY RT PCR  GROUP A STREP BY PCR  PREGNANCY, URINE   Procedures Procedures    Medications Ordered in ED Medications  dexamethasone (DECADRON) tablet 10 mg (10 mg Oral Given 10/17/21 0438)    ED Course/ Medical Decision Making/ A&P  Medical Decision Making Amount and/or Complexity of Data Reviewed Labs: ordered.  Risk Prescription drug management.   Difficulty swallowing which may be a pharyngeal infection.  No evidence of actual pharyngeal obstruction.  Possible globus hystericus.  However, with presence of uvular edema, I do feel that there is likely an infectious cause.  I have ordered COVID and strep PCR, and an oral dose of dexamethasone.  I have reviewed and interpreted the laboratory tests, and my interpretation is negative COVID-19 and strep PCR, no evidence of infection from either agent.  She is discharged with instructions to return if symptoms worsen.  Final Clinical Impression(s) / ED Diagnoses Final diagnoses:   Dysphagia, unspecified type    Rx / DC Orders ED Discharge Orders     None         Dione Booze, MD 10/17/21 [id number]

## 2021-10-17 NOTE — Discharge Instructions (Signed)
Return if symptoms are getting worse. °

## 2021-11-02 DIAGNOSIS — Z0189 Encounter for other specified special examinations: Secondary | ICD-10-CM | POA: Diagnosis not present

## 2021-11-02 DIAGNOSIS — Z3200 Encounter for pregnancy test, result unknown: Secondary | ICD-10-CM | POA: Diagnosis not present

## 2021-11-02 DIAGNOSIS — Z0130 Encounter for examination of blood pressure without abnormal findings: Secondary | ICD-10-CM | POA: Diagnosis not present

## 2021-11-02 DIAGNOSIS — N760 Acute vaginitis: Secondary | ICD-10-CM | POA: Diagnosis not present

## 2021-11-02 DIAGNOSIS — A64 Unspecified sexually transmitted disease: Secondary | ICD-10-CM | POA: Diagnosis not present

## 2021-12-04 ENCOUNTER — Ambulatory Visit: Payer: Medicaid Other | Attending: Internal Medicine | Admitting: Internal Medicine

## 2021-12-10 ENCOUNTER — Encounter (HOSPITAL_BASED_OUTPATIENT_CLINIC_OR_DEPARTMENT_OTHER): Payer: Self-pay

## 2021-12-10 ENCOUNTER — Other Ambulatory Visit: Payer: Self-pay

## 2021-12-10 DIAGNOSIS — R220 Localized swelling, mass and lump, head: Secondary | ICD-10-CM | POA: Insufficient documentation

## 2021-12-10 DIAGNOSIS — R0789 Other chest pain: Secondary | ICD-10-CM | POA: Diagnosis not present

## 2021-12-10 DIAGNOSIS — Z8616 Personal history of COVID-19: Secondary | ICD-10-CM | POA: Insufficient documentation

## 2021-12-10 DIAGNOSIS — F17200 Nicotine dependence, unspecified, uncomplicated: Secondary | ICD-10-CM | POA: Insufficient documentation

## 2021-12-10 DIAGNOSIS — J45909 Unspecified asthma, uncomplicated: Secondary | ICD-10-CM | POA: Diagnosis not present

## 2021-12-10 DIAGNOSIS — R0602 Shortness of breath: Secondary | ICD-10-CM | POA: Insufficient documentation

## 2021-12-10 LAB — CBC WITH DIFFERENTIAL/PLATELET
Abs Immature Granulocytes: 0.01 10*3/uL (ref 0.00–0.07)
Basophils Absolute: 0 10*3/uL (ref 0.0–0.1)
Basophils Relative: 0 %
Eosinophils Absolute: 0.1 10*3/uL (ref 0.0–0.5)
Eosinophils Relative: 2 %
HCT: 41 % (ref 36.0–46.0)
Hemoglobin: 13.4 g/dL (ref 12.0–15.0)
Immature Granulocytes: 0 %
Lymphocytes Relative: 37 %
Lymphs Abs: 1.7 10*3/uL (ref 0.7–4.0)
MCH: 28.6 pg (ref 26.0–34.0)
MCHC: 32.7 g/dL (ref 30.0–36.0)
MCV: 87.4 fL (ref 80.0–100.0)
Monocytes Absolute: 0.5 10*3/uL (ref 0.1–1.0)
Monocytes Relative: 10 %
Neutro Abs: 2.4 10*3/uL (ref 1.7–7.7)
Neutrophils Relative %: 51 %
Platelets: 200 10*3/uL (ref 150–400)
RBC: 4.69 MIL/uL (ref 3.87–5.11)
RDW: 13.3 % (ref 11.5–15.5)
WBC: 4.6 10*3/uL (ref 4.0–10.5)
nRBC: 0 % (ref 0.0–0.2)

## 2021-12-10 NOTE — ED Triage Notes (Signed)
Pt reports eye swelling started tonight Cp and SHOB x 1 month on/off + swelling under rt. eye

## 2021-12-11 ENCOUNTER — Emergency Department (HOSPITAL_BASED_OUTPATIENT_CLINIC_OR_DEPARTMENT_OTHER)
Admission: EM | Admit: 2021-12-11 | Discharge: 2021-12-11 | Disposition: A | Discharge status: Home / Self Care | Payer: Medicaid Other | Attending: Emergency Medicine | Admitting: Emergency Medicine

## 2021-12-11 ENCOUNTER — Emergency Department (HOSPITAL_BASED_OUTPATIENT_CLINIC_OR_DEPARTMENT_OTHER): Payer: Medicaid Other

## 2021-12-11 DIAGNOSIS — R030 Elevated blood-pressure reading, without diagnosis of hypertension: Secondary | ICD-10-CM | POA: Diagnosis not present

## 2021-12-11 DIAGNOSIS — R0602 Shortness of breath: Secondary | ICD-10-CM | POA: Diagnosis not present

## 2021-12-11 DIAGNOSIS — H5789 Other specified disorders of eye and adnexa: Secondary | ICD-10-CM

## 2021-12-11 DIAGNOSIS — L508 Other urticaria: Secondary | ICD-10-CM | POA: Diagnosis not present

## 2021-12-11 DIAGNOSIS — R0789 Other chest pain: Secondary | ICD-10-CM

## 2021-12-11 DIAGNOSIS — Z0130 Encounter for examination of blood pressure without abnormal findings: Secondary | ICD-10-CM | POA: Diagnosis not present

## 2021-12-11 DIAGNOSIS — A64 Unspecified sexually transmitted disease: Secondary | ICD-10-CM | POA: Diagnosis not present

## 2021-12-11 DIAGNOSIS — Z3200 Encounter for pregnancy test, result unknown: Secondary | ICD-10-CM | POA: Diagnosis not present

## 2021-12-11 DIAGNOSIS — Z0189 Encounter for other specified special examinations: Secondary | ICD-10-CM | POA: Diagnosis not present

## 2021-12-11 LAB — COMPREHENSIVE METABOLIC PANEL
ALT: 21 U/L (ref 0–44)
AST: 22 U/L (ref 15–41)
Albumin: 4.4 g/dL (ref 3.5–5.0)
Alkaline Phosphatase: 57 U/L (ref 38–126)
Anion gap: 7 (ref 5–15)
BUN: 14 mg/dL (ref 6–20)
CO2: 27 mmol/L (ref 22–32)
Calcium: 9.2 mg/dL (ref 8.9–10.3)
Chloride: 104 mmol/L (ref 98–111)
Creatinine, Ser: 0.77 mg/dL (ref 0.44–1.00)
GFR, Estimated: >60 mL/min (ref >60)
Glucose, Bld: 85 mg/dL (ref 70–99)
Potassium: 3.7 mmol/L (ref 3.5–5.1)
Sodium: 138 mmol/L (ref 135–145)
Total Bilirubin: 0.7 mg/dL (ref 0.3–1.2)
Total Protein: 7 g/dL (ref 6.5–8.1)

## 2021-12-11 LAB — TROPONIN I (HIGH SENSITIVITY): Troponin I (High Sensitivity): <2 ng/L (ref <18)

## 2021-12-11 MED ORDER — ALBUTEROL SULFATE HFA 108 (90 BASE) MCG/ACT IN AERS
2.0000 | INHALATION_SPRAY | RESPIRATORY_TRACT | Status: DC | PRN
  Filled 2021-12-11: qty 6.7

## 2021-12-11 MED ORDER — AEROCHAMBER PLUS FLO-VU LARGE MISC
1.0000 | Freq: Once | Status: AC
  Administered 2021-12-11: 1
  Filled 2021-12-11: qty 1

## 2021-12-11 NOTE — ED Notes (Signed)
RT assessed pt in triage. Pt states she does not believe this is related to her asthma that it appears to be GI related. RT educated pt on asthma and medication proper usage. Pt verbalizes understanding.

## 2021-12-11 NOTE — ED Provider Notes (Signed)
[facility] EMERGENCY DEPT Provider Note  CSN: [id number] Arrival date & time: 12/10/21 2308  Chief Complaint(s) Eye Problem, Chest Pain, and Shortness of Breath  HPI Rachael Tanner is a 21 y.o. female here for right eye swelling noted this evening.  No trauma.  No visual disturbance.  No redness.  She reports that it feels slightly itchy.  No recent fevers or infections.  No coughing or congestion.  Patient also endorsing intermittent comfort and shortness of breath past several weeks.  Nonexertional.  Nonradiating.  Described as shortness of her breasts.  Tender to palpation.  The history is provided by the patient.    Past Medical History Past Medical History:  Diagnosis Date   Anxiety    Anxiety    Asthma    COVID-19    Marijuana use    Palpitations    STD (female)    Tobacco abuse    Patient Active Problem List   Diagnosis Date Noted   Polycythemia 03/29/2020   Smoker 03/29/2020   PVC (premature ventricular contraction) 02/18/2020   Non-cardiac chest pain 11/25/2019   Palpitations 11/25/2019   Shortness of breath 11/25/2019   Home Medication(s) Prior to Admission medications   Medication Sig Start Date End Date Taking? Authorizing Provider  albuterol (VENTOLIN HFA) 108 (90 Base) MCG/ACT inhaler Inhale 2 puffs into the lungs every 6 (six) hours as needed for wheezing or shortness of breath. 05/24/20   Marcine Matar, MD                                                                                                                                    Allergies Patient has no known allergies.  Review of Systems Review of Systems As noted in HPI  Physical Exam Vital Signs  I have reviewed the triage vital signs BP 109/85   Pulse 68   Temp 98.7 F (37.1 C) (Oral)   Resp 13   Ht 5\' 5"  (1.651 m)   Wt 65.8 kg   LMP 12/10/2021 (Exact Date)   SpO2 98%   BMI 24.13 kg/m   Physical Exam Vitals reviewed.  Constitutional:      General: She is  not in acute distress.    Appearance: She is well-developed. She is not diaphoretic.  HENT:     Head: Normocephalic and atraumatic.     Nose: Nose normal.  Eyes:     General: No scleral icterus.       Right eye: No discharge.        Left eye: No discharge.     Conjunctiva/sclera: Conjunctivae normal.     Pupils: Pupils are equal, round, and reactive to light.  Cardiovascular:     Rate and Rhythm: Normal rate and regular rhythm.     Heart sounds: No murmur heard.    No friction rub. No gallop.  Pulmonary:     Effort: Pulmonary effort is normal. No  respiratory distress.     Breath sounds: Normal breath sounds. No stridor. No wheezing, rhonchi or rales.  Chest:     Chest wall: Tenderness present.    Abdominal:     General: There is no distension.     Palpations: Abdomen is soft.     Tenderness: There is no abdominal tenderness.  Musculoskeletal:        General: No tenderness.     Cervical back: Normal range of motion and neck supple.  Skin:    General: Skin is warm and dry.     Findings: No erythema or rash.  Neurological:     Mental Status: She is alert and oriented to person, place, and time.     ED Results and Treatments Labs (all labs ordered are listed, but only abnormal results are displayed) Labs Reviewed  CBC WITH DIFFERENTIAL/PLATELET  COMPREHENSIVE METABOLIC PANEL  TROPONIN I (HIGH SENSITIVITY)                                                                                                                         EKG  EKG Interpretation  Date/Time:  Monday December 11 2021 04:14:38 EST Ventricular Rate:  68 PR Interval:  157 QRS Duration: 69 QT Interval:  413 QTC Calculation: 440 R Axis:   49 Text Interpretation: Sinus rhythm reversal corrected. No significant change was found Confirmed by Drema Pry [phone]) on 12/11/2021 4:57:09 AM       Radiology DG Chest Port 1 View  Result Date: 12/11/2021 CLINICAL DATA:  [id number] with chest discomfort and  shortness of breath. EXAM: PORTABLE CHEST 1 VIEW COMPARISON:  PA Lat 03/06/2020 FINDINGS: The heart size and mediastinal contours are within normal limits. Both lungs are clear. The visualized skeletal structures are unremarkable. IMPRESSION: No active disease.  Stable chest. Electronically Signed   By: Almira Bar M.D.   On: 12/11/2021 04:35    Medications Ordered in ED Medications  albuterol (VENTOLIN HFA) 108 (90 Base) MCG/ACT inhaler 2 puff (has no administration in time range)  AeroChamber Plus Flo-Vu Large MISC 1 each (1 each Other Given 12/11/21 0327)                                                                                                                                     Procedures Procedures  (including critical care time)  Medical Decision Making / ED Course  Medical Decision Making Amount and/or Complexity of Data Reviewed Labs: ordered. Decision-making details documented in ED Course. Radiology: ordered and independent interpretation performed. Decision-making details documented in ED Course. ECG/medicine tests: ordered and independent interpretation performed. Decision-making details documented in ED Course.  Risk Prescription drug management.     Complexity of Problem:  Patient's presenting problem/concern, DDX, and MDM listed below: Right eye periorbital swelling No evidence of cellulitis.  Doubt orbital cellulitis.  No rashes. Intermittent chest pain and shortness of breath Chest pain appears to be chest wall related Cardiac work-up in triage was obtained including chest x-ray. Low suspicion for ACS, doubt PE, or dissection.    Complexity of Data:   Cardiac Monitoring/EKG: EKG without acute ischemic changes or evidence of pericarditis.  Laboratory Tests ordered listed below with my independent interpretation: CBC without leukocytosis or anemia Metabolic panel without significant electrolyte derangement or renal insufficiency Troponin negative.   No need for repeat as ACS is unlikely.   Imaging Studies ordered listed below with my independent interpretation: Chest x-ray without evidence of pneumonia, pneumothorax, pulmonary edema.          Final Clinical Impression(s) / ED Diagnoses Final diagnoses:  Periorbital swelling  Chest discomfort   The patient appears reasonably screened and/or stabilized for discharge and I doubt any other medical condition or other [facility] requiring further screening, evaluation, or treatment in the ED at this time. I have discussed the findings, Dx and Tx plan with the patient/family who expressed understanding and agree(s) with the plan. Discharge instructions discussed at length. The patient/family was given strict return precautions who verbalized understanding of the instructions. No further questions at time of discharge.  Disposition: Discharge  Condition: Good  ED Discharge Orders     None      Follow Up: Ladell Pier, MD [address] [place] [address] [phone]  Call  to schedule an appointment for close follow up           This chart was dictated using voice recognition software.  Despite best efforts to proofread,  errors can occur which can change the documentation meaning.    Fatima Blank, MD 12/11/21 [phone]

## 2021-12-21 ENCOUNTER — Ambulatory Visit (HOSPITAL_COMMUNITY)
Admission: EM | Admit: 2021-12-21 | Discharge: 2021-12-21 | Disposition: A | Discharge status: Home / Self Care | Payer: Medicaid Other | Attending: Family Medicine | Admitting: Family Medicine

## 2021-12-21 ENCOUNTER — Encounter (HOSPITAL_COMMUNITY): Payer: Self-pay

## 2021-12-21 DIAGNOSIS — N760 Acute vaginitis: Secondary | ICD-10-CM | POA: Insufficient documentation

## 2021-12-21 MED ORDER — CLINDAMYCIN PHOSPHATE 2 % VA CREA: 1.0000 | TOPICAL_CREAM | Freq: Every day | VAGINAL | 0 refills | Status: DC

## 2021-12-21 MED ORDER — FLUCONAZOLE 150 MG PO TABS: 150.0000 mg | ORAL_TABLET | ORAL | 0 refills | Status: DC

## 2021-12-21 NOTE — ED Triage Notes (Signed)
Pt reports vaginal discharge x 6 days. Pt would like STD testing.

## 2021-12-21 NOTE — ED Provider Notes (Addendum)
[facility]    CSN: [id number] Arrival date & time: 12/21/21  1927      History   Chief Complaint Chief Complaint  Patient presents with   Exposure to STD   Vaginal Discharge    HPI Rachael Tanner is a 21 y.o. female.    Exposure to STD  Vaginal Discharge  Here for vaginal discharge is been going on for the last 5 or 6 days, after she finished her period.  No fever or vomiting or abdominal pain.  She has not really had any itching; there is a little bit of a smell.  She wishes to be treated for BV and yeast that she had last time she had similar symptoms.  Past Medical History:  Diagnosis Date   Anxiety    Anxiety    Asthma    COVID-19    Marijuana use    Palpitations    STD (female)    Tobacco abuse     Patient Active Problem List   Diagnosis Date Noted   Polycythemia 03/29/2020   Smoker 03/29/2020   PVC (premature ventricular contraction) 02/18/2020   Non-cardiac chest pain 11/25/2019   Palpitations 11/25/2019   Shortness of breath 11/25/2019    History reviewed. No pertinent surgical history.  OB History   No obstetric history on file.      Home Medications    Prior to Admission medications   Medication Sig Start Date End Date Taking? Authorizing Provider  clindamycin (CLEOCIN) 2 % vaginal cream Place 1 Applicatorful vaginally at bedtime for 7 days. 12/21/21 12/28/21 Yes Zenia Resides, MD  fluconazole (DIFLUCAN) 150 MG tablet Take 1 tablet (150 mg total) by mouth every 3 (three) days for 2 doses. 12/21/21 12/25/21 Yes Alonah Lineback, Janace Aris, MD  albuterol (VENTOLIN HFA) 108 (90 Base) MCG/ACT inhaler Inhale 2 puffs into the lungs every 6 (six) hours as needed for wheezing or shortness of breath. 05/24/20   Marcine Matar, MD    Family History Family History  Problem Relation Age of Onset   Hypertension Mother     Social History Social History   Tobacco Use   Smoking status: Some Days    Types: Cigars   Smokeless  tobacco: Never  Vaping Use   Vaping Use: Never used  Substance Use Topics   Alcohol use: Yes    Comment: "weekends"   Drug use: Not Currently    Types: Marijuana    Comment: last use May '21     Allergies   Patient has no known allergies.   Review of Systems Review of Systems  Genitourinary:  Positive for vaginal discharge.     Physical Exam Triage Vital Signs ED Triage Vitals [12/21/21 1958]  Enc Vitals Group     BP 137/78     Pulse Rate 86     Resp 18     Temp 98.3 F (36.8 C)     Temp Source Oral     SpO2 98 %     Weight      Height      Head Circumference      Peak Flow      Pain Score      Pain Loc      Pain Edu?      Excl. in GC?    No data found.  Updated Vital Signs BP 137/78 (BP Location: Left Arm)   Pulse 86   Temp 98.3 F (36.8 C) (Oral)   Resp 18  LMP 12/10/2021 (Exact Date)   SpO2 98%   Visual Acuity Right Eye Distance:   Left Eye Distance:   Bilateral Distance:    Right Eye Near:   Left Eye Near:    Bilateral Near:     Physical Exam Vitals reviewed.  Constitutional:      General: She is not in acute distress.    Appearance: She is not ill-appearing, toxic-appearing or diaphoretic.  Skin:    Coloration: Skin is not jaundiced or pale.  Neurological:     Mental Status: She is alert and oriented to person, place, and time.  Psychiatric:        Behavior: Behavior normal.      UC Treatments / Results  Labs (all labs ordered are listed, but only abnormal results are displayed) Labs Reviewed  CERVICOVAGINAL ANCILLARY ONLY    EKG   Radiology No results found.  Procedures Procedures (including critical care time)  Medications Ordered in UC Medications - No data to display  Initial Impression / Assessment and Plan / UC Course  I have reviewed the triage vital signs and the nursing notes.  Pertinent labs & imaging results that were available during my care of the patient were reviewed by me and considered in my  medical decision making (see chart for details).        Parikh treatment will be sent with fluconazole and she was to be treated with vaginal remedy for BV, so clindamycin is sent.  Staff will notify her of any positives on the swab Final Clinical Impressions(s) / UC Diagnoses   Final diagnoses:  Acute vaginitis     Discharge Instructions      Take fluconazole 150 mg--1 tablet every 3 days for 2 doses  Clindamycin vaginal cream--Place 1 applicatorful in the vagina at bedtime for 7 days  Staff will notify you of any positives on the swab     ED Prescriptions     Medication Sig Dispense Auth. Provider   fluconazole (DIFLUCAN) 150 MG tablet Take 1 tablet (150 mg total) by mouth every 3 (three) days for 2 doses. 2 tablet Zenia Resides, MD   clindamycin (CLEOCIN) 2 % vaginal cream Place 1 Applicatorful vaginally at bedtime for 7 days. 40 g Zenia Resides, MD      PDMP not reviewed this encounter.   Zenia Resides, MD 12/21/21 2023    Zenia Resides, MD 12/21/21 [phone]

## 2021-12-21 NOTE — Discharge Instructions (Signed)
Take fluconazole 150 mg--1 tablet every 3 days for 2 doses  Clindamycin vaginal cream--Place 1 applicatorful in the vagina at bedtime for 7 days  Staff will notify you of any positives on the swab

## 2021-12-22 ENCOUNTER — Telehealth (HOSPITAL_COMMUNITY): Payer: Self-pay | Admitting: Family Medicine

## 2021-12-22 LAB — CERVICOVAGINAL ANCILLARY ONLY
Bacterial Vaginitis (gardnerella): POSITIVE — AB
Candida Glabrata: NEGATIVE
Candida Vaginitis: POSITIVE — AB
Chlamydia: NEGATIVE
Comment: NEGATIVE
Comment: NEGATIVE
Comment: NEGATIVE
Comment: NEGATIVE
Comment: NEGATIVE
Comment: NORMAL
Neisseria Gonorrhea: NEGATIVE
Trichomonas: NEGATIVE

## 2021-12-22 MED ORDER — FLUCONAZOLE 150 MG PO TABS: 150.0000 mg | ORAL_TABLET | ORAL | 0 refills | Status: AC

## 2021-12-22 MED ORDER — METRONIDAZOLE 0.75 % VA GEL: 1.0000 | Freq: Two times a day (BID) | VAGINAL | 0 refills | Status: DC

## 2021-12-22 NOTE — Telephone Encounter (Signed)
Pt called today; clindamycin vag cream generic not covered by medicaid, and requested alternative and diflucan be sent to [facility] instead. Rx's for diflucan and for metrogel vag sent to [facility]

## 2021-12-22 NOTE — Telephone Encounter (Signed)
Pt states insurance will not cover RX sent in, requesting new RX be sent to new pharmacy.

## 2022-01-19 DIAGNOSIS — R059 Cough, unspecified: Secondary | ICD-10-CM | POA: Diagnosis not present

## 2022-01-19 DIAGNOSIS — J029 Acute pharyngitis, unspecified: Secondary | ICD-10-CM | POA: Diagnosis not present

## 2022-01-27 ENCOUNTER — Encounter (HOSPITAL_BASED_OUTPATIENT_CLINIC_OR_DEPARTMENT_OTHER): Payer: Self-pay | Admitting: Emergency Medicine

## 2022-01-27 ENCOUNTER — Emergency Department (HOSPITAL_BASED_OUTPATIENT_CLINIC_OR_DEPARTMENT_OTHER)
Admission: EM | Admit: 2022-01-27 | Discharge: 2022-01-27 | Disposition: A | Discharge status: Home / Self Care | Payer: Medicaid Other | Attending: Emergency Medicine | Admitting: Emergency Medicine

## 2022-01-27 ENCOUNTER — Other Ambulatory Visit: Payer: Self-pay

## 2022-01-27 DIAGNOSIS — Z1152 Encounter for screening for COVID-19: Secondary | ICD-10-CM | POA: Insufficient documentation

## 2022-01-27 DIAGNOSIS — B9789 Other viral agents as the cause of diseases classified elsewhere: Secondary | ICD-10-CM | POA: Diagnosis not present

## 2022-01-27 DIAGNOSIS — N898 Other specified noninflammatory disorders of vagina: Secondary | ICD-10-CM | POA: Diagnosis not present

## 2022-01-27 DIAGNOSIS — R059 Cough, unspecified: Secondary | ICD-10-CM | POA: Diagnosis present

## 2022-01-27 DIAGNOSIS — J069 Acute upper respiratory infection, unspecified: Secondary | ICD-10-CM | POA: Diagnosis not present

## 2022-01-27 LAB — WET PREP, GENITAL
Clue Cells Wet Prep HPF POC: NONE SEEN
Sperm: NONE SEEN
Trich, Wet Prep: NONE SEEN
WBC, Wet Prep HPF POC: <10 (ref <10)
Yeast Wet Prep HPF POC: NONE SEEN

## 2022-01-27 LAB — RESP PANEL BY RT-PCR (RSV, FLU A&B, COVID)  RVPGX2
Influenza A by PCR: NEGATIVE
Influenza B by PCR: NEGATIVE
Resp Syncytial Virus by PCR: NEGATIVE
SARS Coronavirus 2 by RT PCR: NEGATIVE

## 2022-01-27 LAB — PREGNANCY, URINE: Preg Test, Ur: NEGATIVE

## 2022-01-27 MED ORDER — CEFTRIAXONE SODIUM 1 G IJ SOLR
1.0000 g | Freq: Once | INTRAMUSCULAR | Status: AC
  Administered 2022-01-27: 1 g via INTRAMUSCULAR
  Filled 2022-01-27: qty 10

## 2022-01-27 MED ORDER — DOXYCYCLINE HYCLATE 100 MG PO CAPS: 100.0000 mg | ORAL_CAPSULE | Freq: Two times a day (BID) | ORAL | 0 refills | Status: DC

## 2022-01-27 MED ORDER — DOXYCYCLINE HYCLATE 100 MG PO TABS
100.0000 mg | ORAL_TABLET | Freq: Once | ORAL | Status: AC
  Administered 2022-01-27: 100 mg via ORAL
  Filled 2022-01-27: qty 1

## 2022-01-27 NOTE — ED Triage Notes (Signed)
Pt c/o cough and congestion x 3 days. Pt states she also wants a STD screening.

## 2022-01-27 NOTE — ED Provider Notes (Signed)
[facility] EMERGENCY DEPT  Provider Note  CSN: [id number] Arrival date & time: 01/27/22 0020  History Chief Complaint  Patient presents with   Cough    Rachael Tanner is a 21 y.o. female presents for evaluation of 4 days of URI symptoms. No fever. Has been around others with flu recently.  She also reports a new sexual partner, some mild vaginal discharge and itching. Requesting STI testing.    Home Medications Prior to Admission medications   Medication Sig Start Date End Date Taking? Authorizing Provider  doxycycline (VIBRAMYCIN) 100 MG capsule Take 1 capsule (100 mg total) by mouth 2 (two) times daily. 01/27/22  Yes Pollyann Savoy, MD  albuterol (VENTOLIN HFA) 108 (90 Base) MCG/ACT inhaler Inhale 2 puffs into the lungs every 6 (six) hours as needed for wheezing or shortness of breath. 05/24/20   Marcine Matar, MD  metroNIDAZOLE (METROGEL) 0.75 % vaginal gel Place 1 Applicatorful vaginally 2 (two) times daily. 12/22/21   Zenia Resides, MD     Allergies    Patient has no known allergies.   Review of Systems   Review of Systems Please see HPI for pertinent positives and negatives  Physical Exam BP 111/83   Pulse 82   Temp 98.3 F (36.8 C) (Oral)   Resp 18   LMP 01/05/2022   SpO2 99%   Physical Exam Vitals and nursing note reviewed.  Constitutional:      Appearance: Normal appearance.  HENT:     Head: Normocephalic and atraumatic.     Nose: Nose normal.     Mouth/Throat:     Mouth: Mucous membranes are moist.  Eyes:     Extraocular Movements: Extraocular movements intact.     Conjunctiva/sclera: Conjunctivae normal.  Cardiovascular:     Rate and Rhythm: Normal rate.  Pulmonary:     Effort: Pulmonary effort is normal.     Breath sounds: Normal breath sounds.  Abdominal:     General: Abdomen is flat.     Palpations: Abdomen is soft.     Tenderness: There is no abdominal tenderness.  Genitourinary:    Comments: Deferred,  patient prefers self-swab Musculoskeletal:        General: No swelling. Normal range of motion.     Cervical back: Neck supple.  Skin:    General: Skin is warm and dry.  Neurological:     General: No focal deficit present.     Mental Status: She is alert.  Psychiatric:        Mood and Affect: Mood normal.     ED Results / Procedures / Treatments   EKG None  Procedures Procedures  Medications Ordered in the ED Medications  cefTRIAXone (ROCEPHIN) injection 1 g (has no administration in time range)  doxycycline (VIBRA-TABS) tablet 100 mg (has no administration in time range)    Initial Impression and Plan  Patient primarily here for URI. Exam is benign. Covid/Flu/RSV swab is neg. Also requesting STI testing, she will self swab.   ED Course   Clinical Course as of 01/27/22 0432  Sat Jan 27, 2022  0416 Wet prep is neg. After initially asking to hold off on empiric Abx, she would now like to be treated pending GC/CT swab. Will add hcg prior to initiating Abx.  [CS]    Clinical Course User Index [CS] Pollyann Savoy, MD     MDM Rules/Calculators/A&P Medical Decision Making Problems Addressed: Vaginal discharge: acute illness or injury Viral URI with  cough: acute illness or injury  Amount and/or Complexity of Data Reviewed Labs: ordered. Decision-making details documented in ED Course.  Risk Prescription drug management.    Final Clinical Impression(s) / ED Diagnoses Final diagnoses:  Viral URI with cough  Vaginal discharge    Rx / DC Orders ED Discharge Orders          Ordered    doxycycline (VIBRAMYCIN) 100 MG capsule  2 times daily        01/27/22 0431             Pollyann Savoy, MD 01/27/22 [phone]

## 2022-01-30 LAB — GC/CHLAMYDIA PROBE AMP (~~LOC~~) NOT AT ARMC
Chlamydia: NEGATIVE
Comment: NEGATIVE
Comment: NORMAL
Neisseria Gonorrhea: NEGATIVE

## 2022-03-07 NOTE — Progress Notes (Unsigned)
Cardiology Office Note:    Date:  03/08/2022   ID:  Rachael Tanner, DOB 08-18-2000, MRN [id number]  PCP:  Ladell Pier, MD  [facility] Cardiologist:  Rudean Haskell MD [place] Electrophysiologist:  None   Referring MD: Ladell Pier, MD   CC: follow up PVCs  History of Present Illness:    Rachael Tanner is a 22 y.o. female with a hx of tobacco abuse, marijuana abuse, and anxiety who presented 11/25/19.   2022: In interval had normal echo and stress test; with a heart monitor that showed occasional PVCs, last seen 02/18/20.  In interim of this visit, patient has had multiple evaluations for chest pain in the ED.   NO SHOW.  NO CHARGE.  CHART REVIEWED.     Past Medical History:  Diagnosis Date   Anxiety    Anxiety    Asthma    COVID-19    Marijuana use    Palpitations    STD (female)    Tobacco abuse    No past surgical history on file.  Current Medications: No outpatient medications have been marked as taking for the 03/08/22 encounter (Office Visit) with Werner Lean, MD.    Allergies:   Patient has no known allergies.   Social History   Socioeconomic History   Marital status: Single    Spouse name: Not on file   Number of children: Not on file   Years of education: Not on file   Highest education level: Not on file  Occupational History   Not on file  Tobacco Use   Smoking status: Some Days    Types: Cigars   Smokeless tobacco: Never  Vaping Use   Vaping Use: Never used  Substance and Sexual Activity   Alcohol use: Yes    Comment: "weekends"   Drug use: Not Currently    Types: Marijuana    Comment: last use May '21   Sexual activity: Yes  Other Topics Concern   Not on file  Social History Narrative   Not on file   Social Determinants of Health   Financial Resource Strain: Not on file  Food Insecurity: Not on file  Transportation Needs: Not on file  Physical Activity: Not on file  Stress: Not on file   Social Connections: Not on file     Family History: The patient's family history includes Hypertension in her mother. No arrhythmia history  ROS:   Please see the history of present illness.    All other systems reviewed and are negative.  EKGs/Labs/Other Studies Reviewed:    The following studies were reviewed today:  EKG:   11/02/19- SR 92 WNL  Cardiac Studies & Procedures     STRESS TESTS  EXERCISE TOLERANCE TEST (ETT) 12/22/2019  Narrative  Blood pressure demonstrated a normal response to exercise.  There was no ST segment deviation noted during stress.  ETT with normal exercise tolerance (9:00); no chest pain; normal blood pressure response; no ST changes; negative adequate exercise tolerance test; [facility] treadmill score 9.   ECHOCARDIOGRAM  ECHOCARDIOGRAM COMPLETE 12/22/2019  Narrative ECHOCARDIOGRAM REPORT    Patient Name:   Rachael Tanner Date of Exam: 12/22/2019 Medical Rec #:  [id number]       Height:       66.0 in Accession #:    [id number]      Weight:       149.2 lb Date of Birth:  28-Mar-2000  BSA:          1.766 m Patient Age:    22 years        BP:           120/75 mmHg Patient Gender: F               HR:           75 bpm. Exam Location:  [address]  Procedure: 2D Echo, Cardiac Doppler and Color Doppler  Indications:    R07.9 Chest Pain  History:        Patient has no prior history of Echocardiogram examinations. Signs/Symptoms:Shortness of Breath and Edema; Risk Factors:Current Smoker and Marijuana abuse. Hx of COVID 19.  Sonographer:    Marygrace Drought RCS Referring Phys: [id number] [place] A Kattie Santoyo  IMPRESSIONS   1. Left ventricular ejection fraction, by estimation, is 60 to 65%. The left ventricle has normal function. The left ventricle has no regional wall motion abnormalities. Left ventricular diastolic parameters were normal. 2. Right ventricular systolic function is normal. The right ventricular size is normal. There is  normal pulmonary artery systolic pressure. The estimated right ventricular systolic pressure is 67.5 mmHg. 3. The mitral valve is normal in structure. No evidence of mitral valve regurgitation. No evidence of mitral stenosis. 4. The aortic valve is normal in structure. Aortic valve regurgitation is not visualized. No aortic stenosis is present. 5. The inferior vena cava is normal in size with greater than 50% respiratory variability, suggesting right atrial pressure of 3 mmHg.  FINDINGS Left Ventricle: Left ventricular ejection fraction, by estimation, is 60 to 65%. The left ventricle has normal function. The left ventricle has no regional wall motion abnormalities. The left ventricular internal cavity size was normal in size. There is no left ventricular hypertrophy. Left ventricular diastolic parameters were normal. Normal left ventricular filling pressure.  Right Ventricle: The right ventricular size is normal. No increase in right ventricular wall thickness. Right ventricular systolic function is normal. There is normal pulmonary artery systolic pressure. The tricuspid regurgitant velocity is 2.00 m/s, and with an assumed right atrial pressure of 3 mmHg, the estimated right ventricular systolic pressure is 44.9 mmHg.  Left Atrium: Left atrial size was normal in size.  Right Atrium: Right atrial size was normal in size.  Pericardium: There is no evidence of pericardial effusion.  Mitral Valve: The mitral valve is normal in structure. No evidence of mitral valve regurgitation. No evidence of mitral valve stenosis.  Tricuspid Valve: The tricuspid valve is normal in structure. Tricuspid valve regurgitation is not demonstrated. No evidence of tricuspid stenosis.  Aortic Valve: The aortic valve is normal in structure. Aortic valve regurgitation is not visualized. No aortic stenosis is present.  Pulmonic Valve: The pulmonic valve was normal in structure. Pulmonic valve regurgitation is not  visualized. No evidence of pulmonic stenosis.  Aorta: The aortic root is normal in size and structure.  Venous: The inferior vena cava is normal in size with greater than 50% respiratory variability, suggesting right atrial pressure of 3 mmHg.  IAS/Shunts: No atrial level shunt detected by color flow Doppler.   LEFT VENTRICLE PLAX 2D LVIDd:         4.10 cm  Diastology LVIDs:         2.50 cm  LV e' medial:    14.10 cm/s LV PW:         1.00 cm  LV E/e' medial:  5.8 LV IVS:  0.80 cm  LV e' lateral:   15.60 cm/s LVOT diam:     1.70 cm  LV E/e' lateral: 5.2 LV SV:         35 LV SV Index:   20 LVOT Area:     2.27 cm   RIGHT VENTRICLE RV Basal diam:  2.30 cm RV S prime:     11.00 cm/s TAPSE (M-mode): 1.6 cm  LEFT ATRIUM             Index       RIGHT ATRIUM          Index LA diam:        2.40 cm 1.36 cm/m  RA Area:     8.16 cm LA Vol (A2C):   26.6 ml 15.07 ml/m RA Volume:   14.60 ml 8.27 ml/m LA Vol (A4C):   21.0 ml 11.89 ml/m LA Biplane Vol: 24.1 ml 13.65 ml/m AORTIC VALVE LVOT Vmax:   75.70 cm/s LVOT Vmean:  59.300 cm/s LVOT VTI:    0.156 m  AORTA Ao Root diam: 2.30 cm Ao Asc diam:  2.40 cm  MITRAL VALVE               TRICUSPID VALVE TR Peak grad:   15.9 mmHg MV Decel Time:             TR Vmax:        199.53 cm/s MV E velocity: 81.40 cm/s MV A velocity: 35.10 cm/s  SHUNTS MV E/A ratio:  2.32        Systemic VTI:  0.16 m Systemic Diam: 1.70 cm  Mihai Croitoru MD Electronically signed by Sanda Klein MD Signature Date/Time: 12/22/2019/3:51:08 PM    Final    MONITORS  LONG TERM MONITOR (3-14 DAYS) 01/10/2020  Narrative  Patient had a minimum heart rate of 60 bpm, maximum heart rate of 193 bpm, and average heart rate of 101 bpm.  Predominant underlying rhythm was sinus rhythm.  Isolated PACs were rare (<1.0%).  Isolated PVCs were occasional (2.3%), with rare couplets, bigeminy, and trigeminy present.  No evidence of complete heart block.   Triggered and diary events associated with sinus rhythm and isolated PVCs.  No malignant arrhythmias. Occasional PVCs, occasionally symptomatic.            Recent Labs: 04/27/2021: TSH 0.964 12/10/2021: ALT 21; BUN 14; Creatinine, Ser 0.77; Hemoglobin 13.4; Platelets 200; Potassium 3.7; Sodium 138   Physical Exam:    VS:  There were no vitals taken for this visit.    Wt Readings from Last 3 Encounters:  12/10/21 145 lb (65.8 kg)  10/17/21 149 lb 11.1 oz (67.9 kg)  07/02/21 149 lb 14.6 oz (68 kg)     NO SHOW.  NO CHARGE.  CHART REVIEWED.     ASSESSMENT:    1. PVC (premature ventricular contraction)     PLAN:    NO SHOW.  NO CHARGE.  CHART REVIEWED.     Occasional PVCs Non-cardiac chest pain - had issues with therapy- will not plan on re-challenge with BB   PRN f/u Only   Medication Adjustments/Labs and Tests Ordered: Current medicines are reviewed at length with the patient today.  Concerns regarding medicines are outlined above.  No orders of the defined types were placed in this encounter.  No orders of the defined types were placed in this encounter.   There are no Patient Instructions on file for this visit.   Signed, Werner Lean, MD  03/08/2022 1:52 PM     [facility]

## 2022-03-08 ENCOUNTER — Ambulatory Visit: Payer: Medicaid Other | Attending: Internal Medicine | Admitting: Internal Medicine

## 2022-03-08 ENCOUNTER — Telehealth: Payer: Self-pay | Admitting: Internal Medicine

## 2022-03-08 DIAGNOSIS — I493 Ventricular premature depolarization: Secondary | ICD-10-CM

## 2022-03-08 NOTE — Telephone Encounter (Signed)
Per Dr. Gasper Sells patient can be seen as needed due to no show on 02/01 after not being seen since 2022.

## 2022-04-06 ENCOUNTER — Other Ambulatory Visit: Payer: Self-pay

## 2022-04-06 ENCOUNTER — Emergency Department (HOSPITAL_BASED_OUTPATIENT_CLINIC_OR_DEPARTMENT_OTHER)
Admission: EM | Admit: 2022-04-06 | Discharge: 2022-04-06 | Disposition: A | Discharge status: Home / Self Care | Payer: Medicaid Other | Attending: Emergency Medicine | Admitting: Emergency Medicine

## 2022-04-06 ENCOUNTER — Encounter (HOSPITAL_BASED_OUTPATIENT_CLINIC_OR_DEPARTMENT_OTHER): Payer: Self-pay

## 2022-04-06 DIAGNOSIS — R519 Headache, unspecified: Secondary | ICD-10-CM | POA: Insufficient documentation

## 2022-04-06 MED ORDER — KETOROLAC TROMETHAMINE 30 MG/ML IJ SOLN
30.0000 mg | Freq: Once | INTRAMUSCULAR | Status: AC
Start: 2022-04-06 — End: 2022-04-06
  Administered 2022-04-06: 30 mg via INTRAMUSCULAR
  Filled 2022-04-06: qty 1

## 2022-04-06 NOTE — ED Provider Notes (Signed)
[place]  Provider Note  CSN: [id number] Arrival date & time: 04/06/22 0242  History Chief Complaint  Patient presents with   Headache    Rachael Tanner is a 22 y.o. female with no significant PMH reports about a week of persistent frontal headache, not sudden in onset, no photophobia, nausea or vomiting. No fevers. Not improved with occasional motrin use. She denies any head injury, blurry vision, numbness, weakness or difficulty walking.    Home Medications Prior to Admission medications   Medication Sig Start Date End Date Taking? Authorizing Provider  albuterol (VENTOLIN HFA) 108 (90 Base) MCG/ACT inhaler Inhale 2 puffs into the lungs every 6 (six) hours as needed for wheezing or shortness of breath. 05/24/20   Ladell Pier, MD     Allergies    Patient has no known allergies.   Review of Systems   Review of Systems Please see HPI for pertinent positives and negatives  Physical Exam BP 126/75   Pulse 78   Temp 98.1 F (36.7 C) (Oral)   Resp 18   Ht '5\' 5"'$  (1.651 m)   Wt 68 kg   LMP 03/28/2022   SpO2 99%   BMI 24.96 kg/m   Physical Exam Vitals and nursing note reviewed.  Constitutional:      Appearance: Normal appearance.  HENT:     Head: Normocephalic and atraumatic.     Nose: Nose normal.     Mouth/Throat:     Mouth: Mucous membranes are moist.  Eyes:     Extraocular Movements: Extraocular movements intact.     Conjunctiva/sclera: Conjunctivae normal.  Cardiovascular:     Rate and Rhythm: Normal rate.  Pulmonary:     Effort: Pulmonary effort is normal.     Breath sounds: Normal breath sounds.  Abdominal:     General: Abdomen is flat.     Palpations: Abdomen is soft.     Tenderness: There is no abdominal tenderness.  Musculoskeletal:        General: No swelling. Normal range of motion.     Cervical back: Neck supple.  Skin:    General: Skin is warm and dry.  Neurological:     General: No focal  deficit present.     Mental Status: She is alert and oriented to person, place, and time.     Cranial Nerves: No cranial nerve deficit.     Sensory: No sensory deficit.     Motor: No weakness.     Gait: Gait normal.  Psychiatric:        Mood and Affect: Mood normal.     ED Results / Procedures / Treatments   EKG None  Procedures Procedures  Medications Ordered in the ED Medications  ketorolac (TORADOL) 30 MG/ML injection 30 mg (has no administration in time range)    Initial Impression and Plan  Patient here with nonspecific headache, no red flags for significant intracranial process such as ICH, SAH, or mass. Exam is normal. Will give a dose of toradol, recommend PCP follow up if not improving.   ED Course       MDM Rules/Calculators/A&P Medical Decision Making Problems Addressed: Acute nonintractable headache, unspecified headache type: acute illness or injury  Risk Prescription drug management.     Final Clinical Impression(s) / ED Diagnoses Final diagnoses:  Acute nonintractable headache, unspecified headache type    Rx / DC Orders ED Discharge Orders     None  Truddie Hidden, MD 04/06/22 [id number]

## 2022-04-06 NOTE — ED Triage Notes (Signed)
POV from home, GCS 15, A&O x 4, amb to room.  Pt c/o headache x 1 wk, tried ibuprofen at home last dose yesterday around noon. Denies photosensitivity or hx of migraines.

## 2022-04-27 ENCOUNTER — Encounter (HOSPITAL_COMMUNITY): Payer: Self-pay

## 2022-04-27 ENCOUNTER — Ambulatory Visit (HOSPITAL_COMMUNITY)
Admission: RE | Admit: 2022-04-27 | Discharge: 2022-04-27 | Disposition: A | Discharge status: Home / Self Care | Payer: Medicaid Other | Source: Ambulatory Visit | Attending: Emergency Medicine | Admitting: Emergency Medicine

## 2022-04-27 VITALS — BP 121/79 | HR 82 | Temp 98.5°F | Resp 18

## 2022-04-27 DIAGNOSIS — R21 Rash and other nonspecific skin eruption: Secondary | ICD-10-CM | POA: Diagnosis not present

## 2022-04-27 DIAGNOSIS — Z113 Encounter for screening for infections with a predominantly sexual mode of transmission: Secondary | ICD-10-CM | POA: Diagnosis not present

## 2022-04-27 NOTE — ED Triage Notes (Signed)
Pt reports a vaginal rash x 2-3 days. Pt reports some itching. Pt is currently on her cycle and like the provider to look at her rash.

## 2022-04-27 NOTE — Discharge Instructions (Signed)
I recommend using Aquaphor 2-3 times daily on the area of irritation.  It can take several days to a week for the skin to heal up Return if needed  We will call you if anything on your swab returns positive. Please abstain from sexual intercourse until your results return.

## 2022-04-27 NOTE — ED Provider Notes (Signed)
[place]    CSN: [id number] Arrival date & time: 04/27/22  [place]      History   Chief Complaint Chief Complaint  Patient presents with   Rash    Entered by patient    HPI Rachael Tanner is a 22 y.o. female.  Noticed a rash in the vaginal area yesterday Not itchy, not painful. She just felt bumps. Denies vaginal discharge or STD exposure. Would like swab today. Currently on menstrual cycle. Wears period underwear/pads  Past Medical History:  Diagnosis Date   Anxiety    Anxiety    Asthma    COVID-19    Marijuana use    Palpitations    STD (female)    Tobacco abuse     Patient Active Problem List   Diagnosis Date Noted   Polycythemia 03/29/2020   Smoker 03/29/2020   PVC (premature ventricular contraction) 02/18/2020   Non-cardiac chest pain 11/25/2019   Palpitations 11/25/2019   Shortness of breath 11/25/2019    History reviewed. No pertinent surgical history.  OB History   No obstetric history on file.      Home Medications    Prior to Admission medications   Medication Sig Start Date End Date Taking? Authorizing Provider  albuterol (VENTOLIN HFA) 108 (90 Base) MCG/ACT inhaler Inhale 2 puffs into the lungs every 6 (six) hours as needed for wheezing or shortness of breath. 05/24/20   Ladell Pier, MD    Family History Family History  Problem Relation Age of Onset   Hypertension Mother     Social History Social History   Tobacco Use   Smoking status: Some Days    Types: Cigars   Smokeless tobacco: Never  Vaping Use   Vaping Use: Never used  Substance Use Topics   Alcohol use: Yes    Comment: "weekends"   Drug use: Not Currently    Types: Marijuana    Comment: last use May '21     Allergies   Patient has no known allergies.   Review of Systems Review of Systems  Skin:  Positive for rash.   As per HPI  Physical Exam Triage Vital Signs ED Triage Vitals [04/27/22 1913]  Enc Vitals Group     BP 121/79      Pulse Rate 82     Resp 18     Temp 98.5 F (36.9 C)     Temp Source Oral     SpO2 98 %     Weight      Height      Head Circumference      Peak Flow      Pain Score      Pain Loc      Pain Edu?      Excl. in [place]?    No data found.  Updated Vital Signs BP 121/79 (BP Location: Left Arm)   Pulse 82   Temp 98.5 F (36.9 C) (Oral)   Resp 18   LMP 03/28/2022   SpO2 98%    Physical Exam Vitals and nursing note reviewed. Exam conducted with a chaperone present (Arianna CMA).  Cardiovascular:     Rate and Rhythm: Normal rate and regular rhythm.  Pulmonary:     Effort: Pulmonary effort is normal.  Genitourinary:    Exam position: Knee-chest position.       Comments: A few small, non tender bumps where the buttocks rub together. Non tender. No ulceration.  Neurological:     Mental  Status: She is alert and oriented to person, place, and time.     UC Treatments / Results  Labs (all labs ordered are listed, but only abnormal results are displayed) Labs Reviewed  CERVICOVAGINAL ANCILLARY ONLY    EKG   Radiology No results found.  Procedures Procedures (including critical care time)  Medications Ordered in UC Medications - No data to display  Initial Impression / Assessment and Plan / UC Course  I have reviewed the triage vital signs and the nursing notes.  Pertinent labs & imaging results that were available during my care of the patient were reviewed by me and considered in my medical decision making (see chart for details).  Consider heat rash from skin rubbing, moisture from pads. Recommend trying Aquaphor or emollient in the area. Reassuring its not painful or itching. Does not appear to be STD/contagious.  Cytology swab pending per patient request  Can return if symptoms persist or worsen   Final Clinical Impressions(s) / UC Diagnoses   Final diagnoses:  Rash  Screen for STD (sexually transmitted disease)     Discharge Instructions      I  recommend using Aquaphor 2-3 times daily on the area of irritation.  It can take several days to a week for the skin to heal up Return if needed  We will call you if anything on your swab returns positive. Please abstain from sexual intercourse until your results return.    ED Prescriptions   None    PDMP not reviewed this encounter.   Kyra Leyland 04/27/22 1950

## 2022-04-30 ENCOUNTER — Telehealth: Payer: Self-pay | Admitting: Emergency Medicine

## 2022-04-30 LAB — CERVICOVAGINAL ANCILLARY ONLY
Bacterial Vaginitis (gardnerella): POSITIVE — AB
Candida Glabrata: NEGATIVE
Candida Vaginitis: POSITIVE — AB
Chlamydia: NEGATIVE
Comment: NEGATIVE
Comment: NEGATIVE
Comment: NEGATIVE
Comment: NEGATIVE
Comment: NEGATIVE
Comment: NORMAL
Neisseria Gonorrhea: NEGATIVE
Trichomonas: NEGATIVE

## 2022-04-30 MED ORDER — FLUCONAZOLE 150 MG PO TABS: 150.0000 mg | ORAL_TABLET | Freq: Once | ORAL | 0 refills | Status: AC

## 2022-04-30 MED ORDER — METRONIDAZOLE 500 MG PO TABS: 500.0000 mg | ORAL_TABLET | Freq: Two times a day (BID) | ORAL | 0 refills | Status: DC

## 2022-05-16 ENCOUNTER — Ambulatory Visit (HOSPITAL_COMMUNITY): Admit: 2022-05-16 | Payer: Medicaid Other

## 2022-05-19 ENCOUNTER — Encounter (HOSPITAL_COMMUNITY): Payer: Self-pay | Admitting: Emergency Medicine

## 2022-05-19 ENCOUNTER — Ambulatory Visit (HOSPITAL_COMMUNITY)
Admission: EM | Admit: 2022-05-19 | Discharge: 2022-05-19 | Disposition: A | Discharge status: Home / Self Care | Payer: Medicaid Other

## 2022-05-19 DIAGNOSIS — J301 Allergic rhinitis due to pollen: Secondary | ICD-10-CM | POA: Diagnosis not present

## 2022-05-19 DIAGNOSIS — J4521 Mild intermittent asthma with (acute) exacerbation: Secondary | ICD-10-CM | POA: Diagnosis not present

## 2022-05-19 MED ORDER — PROMETHAZINE-DM 6.25-15 MG/5ML PO SYRP: 5.0000 mL | ORAL_SOLUTION | Freq: Every evening | ORAL | 0 refills | Status: DC | PRN

## 2022-05-19 MED ORDER — PREDNISONE 20 MG PO TABS: 40.0000 mg | ORAL_TABLET | Freq: Every day | ORAL | 0 refills | Status: AC

## 2022-05-19 MED ORDER — ALBUTEROL SULFATE (2.5 MG/3ML) 0.083% IN NEBU
INHALATION_SOLUTION | RESPIRATORY_TRACT | Status: AC
  Filled 2022-05-19: qty 3

## 2022-05-19 MED ORDER — ALBUTEROL SULFATE (2.5 MG/3ML) 0.083% IN NEBU
2.5000 mg | INHALATION_SOLUTION | Freq: Once | RESPIRATORY_TRACT | Status: AC
  Administered 2022-05-19: 2.5 mg via RESPIRATORY_TRACT

## 2022-05-19 MED ORDER — ALBUTEROL SULFATE HFA 108 (90 BASE) MCG/ACT IN AERS: 1.0000 | INHALATION_SPRAY | Freq: Four times a day (QID) | RESPIRATORY_TRACT | 1 refills | Status: AC | PRN

## 2022-05-19 NOTE — ED Provider Notes (Signed)
[facility]    CSN: [id number] Arrival date & time: 05/19/22  1353      History   Chief Complaint Chief Complaint  Patient presents with   Wheezing    Entered by patient   Cough    HPI Rachael Tanner is a 22 y.o. female.   Patient presents to urgent care for evaluation of cough, nasal congestion/rhinorrhea, and chest tightness that started 5 days ago.  History of asthma as a child, states this is not flared up in "a long time".  She had a leftover inhaler from when she had bronchitis many months ago and has been using this to help with cough and shortness of breath/wheezing.  States albuterol has been helping slightly.  No recent antibiotic or steroid use.  Denies sore throat, body aches, fever/chills, recent known sick contacts with similar symptoms, nausea, vomiting, abdominal pain, diarrhea, dizziness, chest pain, and heart palpitations.  She has a history of seasonal allergies but has not been using any over-the-counter medications to help with this.  Believes that seasonal allergies and pollen may have triggered her asthma. Current smoker, denies other drug use.    Wheezing Associated symptoms: cough   Cough Associated symptoms: wheezing     Past Medical History:  Diagnosis Date   Anxiety    Anxiety    Asthma    COVID-19    Marijuana use    Palpitations    STD (female)    Tobacco abuse     Patient Active Problem List   Diagnosis Date Noted   Polycythemia 03/29/2020   Smoker 03/29/2020   PVC (premature ventricular contraction) 02/18/2020   Non-cardiac chest pain 11/25/2019   Palpitations 11/25/2019   Shortness of breath 11/25/2019    History reviewed. No pertinent surgical history.  OB History   No obstetric history on file.      Home Medications    Prior to Admission medications   Medication Sig Start Date End Date Taking? Authorizing Provider  cetirizine (ZYRTEC) 10 MG tablet Take 10 mg by mouth daily. 12/11/21  Yes [provider]  promethazine-dextromethorphan (PROMETHAZINE-DM) 6.25-15 MG/5ML syrup Take 5 mLs by mouth at bedtime as needed for cough. 05/19/22  Yes Carlisle Beers, FNP  albuterol (VENTOLIN HFA) 108 (90 Base) MCG/ACT inhaler Inhale 2 puffs into the lungs every 6 (six) hours as needed for wheezing or shortness of breath. 05/24/20   Marcine Matar, MD  metroNIDAZOLE (FLAGYL) 500 MG tablet Take 1 tablet (500 mg total) by mouth 2 (two) times daily. 04/30/22   LampteyBritta Mccreedy, MD    Family History Family History  Problem Relation Age of Onset   Hypertension Mother     Social History Social History   Tobacco Use   Smoking status: Some Days    Types: Cigars   Smokeless tobacco: Never  Vaping Use   Vaping Use: Never used  Substance Use Topics   Alcohol use: Yes    Comment: "weekends"   Drug use: Not Currently    Types: Marijuana    Comment: last use May '21     Allergies   Patient has no known allergies.   Review of Systems Review of Systems  Respiratory:  Positive for cough and wheezing.    Per HPI  Physical Exam Triage Vital Signs ED Triage Vitals  Enc Vitals Group     BP 05/19/22 1448 116/77     Pulse Rate 05/19/22 1448 85     Resp 05/19/22  1448 16     Temp 05/19/22 1448 98.7 F (37.1 C)     Temp Source 05/19/22 1448 Oral     SpO2 05/19/22 1448 97 %     Weight --      Height --      Head Circumference --      Peak Flow --      Pain Score 05/19/22 1447 0     Pain Loc --      Pain Edu? --      Excl. in GC? --    No data found.  Updated Vital Signs BP 116/77 (BP Location: Right Arm)   Pulse 85   Temp 98.7 F (37.1 C) (Oral)   Resp 16   LMP 04/25/2022   SpO2 97%   Visual Acuity Right Eye Distance:   Left Eye Distance:   Bilateral Distance:    Right Eye Near:   Left Eye Near:    Bilateral Near:     Physical Exam Vitals and nursing note reviewed.  Constitutional:      Appearance: She is not ill-appearing or toxic-appearing.  HENT:      Head: Normocephalic and atraumatic.     Right Ear: Hearing, tympanic membrane, ear canal and external ear normal.     Left Ear: Hearing, tympanic membrane, ear canal and external ear normal.     Nose: Rhinorrhea present.     Mouth/Throat:     Lips: Pink.     Mouth: Mucous membranes are moist. No injury.     Tongue: No lesions. Tongue does not deviate from midline.     Palate: No mass and lesions.     Pharynx: Oropharynx is clear. Uvula midline. No pharyngeal swelling, oropharyngeal exudate, posterior oropharyngeal erythema or uvula swelling.     Tonsils: No tonsillar exudate or tonsillar abscesses.  Eyes:     General: Lids are normal. Vision grossly intact. Gaze aligned appropriately.     Extraocular Movements: Extraocular movements intact.     Conjunctiva/sclera: Conjunctivae normal.  Cardiovascular:     Rate and Rhythm: Normal rate and regular rhythm.     Heart sounds: Normal heart sounds, S1 normal and S2 normal.  Pulmonary:     Effort: Pulmonary effort is normal. No respiratory distress.     Breath sounds: Normal air entry. No stridor. Wheezing present. No rhonchi or rales.     Comments: Diffuse expiratory wheezing to bilateral lung fields.  No respiratory distress, speaking full sentences without difficulty. Chest:     Chest wall: No tenderness.  Musculoskeletal:     Cervical back: Neck supple.     Right lower leg: No edema.     Left lower leg: No edema.  Lymphadenopathy:     Cervical: No cervical adenopathy.  Skin:    General: Skin is warm and dry.     Capillary Refill: Capillary refill takes less than 2 seconds.     Findings: No rash.  Neurological:     General: No focal deficit present.     Mental Status: She is alert and oriented to person, place, and time. Mental status is at baseline.     Cranial Nerves: No dysarthria or facial asymmetry.  Psychiatric:        Mood and Affect: Mood normal.        Speech: Speech normal.        Behavior: Behavior normal.         Thought Content: Thought content normal.  Judgment: Judgment normal.      UC Treatments / Results  Labs (all labs ordered are listed, but only abnormal results are displayed) Labs Reviewed - No data to display  EKG   Radiology No results found.  Procedures Procedures (including critical care time)  Medications Ordered in UC Medications  albuterol (PROVENTIL) (2.5 MG/3ML) 0.083% nebulizer solution 2.5 mg (2.5 mg Nebulization Given 05/19/22 1537)    Initial Impression / Assessment and Plan / UC Course  I have reviewed the triage vital signs and the nursing notes.  Pertinent labs & imaging results that were available during my care of the patient were reviewed by me and considered in my medical decision making (see chart for details).   1.  Mild intermittent asthma with acute exacerbation, seasonal allergic rhinitis due to pollen Albuterol breathing treatment given in clinic for wheezing.  Significant improvement in subjective shortness of breath and objective lung sounds after albuterol breathing treatment.  Patient is nontoxic in appearance with hemodynamically stable vital signs and no new oxygen requirement.  Afebrile.  Low suspicion for viral upper respiratory tract infection trigger.  Likely seasonal allergic rhinitis triggers for asthma exacerbation.  Prednisone 40 mg once daily for the next 5 days sent to pharmacy to be taken as prescribed.  No NSAIDs when taking prednisone, advised to take with food to avoid stomach upset.  May use Promethazine DM at bedtime as needed for cough.  Drowsiness precautions regarding Promethazine DM use discussed.  May use Zyrtec 10 mg daily to help with allergic rhinitis.  PCP follow-up recommended in the next 1 to 2 weeks to ensure that symptoms improved.  Discussed physical exam and available lab work findings in clinic with patient.  Counseled patient regarding appropriate use of medications and potential side effects for all medications  recommended or prescribed today. Discussed red flag signs and symptoms of worsening condition,when to call the PCP office, return to urgent care, and when to seek higher level of care in the emergency department. Patient verbalizes understanding and agreement with plan. All questions answered. Patient discharged in stable condition.    Final Clinical Impressions(s) / UC Diagnoses   Final diagnoses:  Mild intermittent asthma with acute exacerbation  Seasonal allergic rhinitis due to pollen     Discharge Instructions      Your symptoms are due to an asthma exacerbation. Please take prednisone 40 mg once daily for the next 5 days to calm down inflammation in your lungs. Do not use any over-the-counter ibuprofen when using prednisone as this can cause stomach upset. Take prednisone with food to avoid stomach upset further.  Continue using albuterol inhaler every 4-6 hours as needed for cough, shortness of breath, and wheeze.  Please use Zyrtec 10 mg once daily for allergies to calm down nasal congestion and cough.  If you develop any new or worsening symptoms or do not improve in the next 2 to 3 days, please return.  If your symptoms are severe, please go to the emergency room.  Follow-up with your primary care provider for further evaluation and management of your symptoms as well as ongoing wellness visits.  I hope you feel better!     ED Prescriptions     Medication Sig Dispense Auth. Provider   promethazine-dextromethorphan (PROMETHAZINE-DM) 6.25-15 MG/5ML syrup Take 5 mLs by mouth at bedtime as needed for cough. 118 mL Carlisle Beers, FNP      PDMP not reviewed this encounter.   Carlisle Beers, [place] 05/19/22 1545

## 2022-05-19 NOTE — ED Triage Notes (Signed)
Pt c/o cough that is dry for about 5 days. Tried Dayquil twice and used inhaler.

## 2022-05-19 NOTE — Discharge Instructions (Addendum)
Your symptoms are due to an asthma exacerbation. Please take prednisone 40 mg once daily for the next 5 days to calm down inflammation in your lungs. Do not use any over-the-counter ibuprofen when using prednisone as this can cause stomach upset. Take prednisone with food to avoid stomach upset further.  Continue using albuterol inhaler every 4-6 hours as needed for cough, shortness of breath, and wheeze.  Please use Zyrtec 10 mg once daily for allergies to calm down nasal congestion and cough.  If you develop any new or worsening symptoms or do not improve in the next 2 to 3 days, please return.  If your symptoms are severe, please go to the emergency room.  Follow-up with your primary care provider for further evaluation and management of your symptoms as well as ongoing wellness visits.  I hope you feel better!

## 2022-09-05 ENCOUNTER — Ambulatory Visit: Payer: Self-pay

## 2022-09-05 NOTE — Telephone Encounter (Signed)
  Chief Complaint: rash spots to both lower legs  Symptoms: itchy, tender to touch, redness  Pertinent Negatives: Patient denies fever Disposition: [] ED /[] Urgent Care (no appt availability in office) / [] Appointment(In office/virtual)/ []  [place] [facility]/ [] Home Care/ [] Refused Recommended Disposition /[x] [place] [facility]/ []  Follow-up with PCP Additional Notes: to bus no appts Reason for Disposition  [1] Severe localized itching AND [2] after 2 days of steroid cream    Has not used steroid cream  Answer Assessment - Initial Assessment Questions 1. APPEARANCE of RASH: "Describe the rash."      Leg 2 bump like spots left  6 spots one leg  both legs  2. LOCATION: "Where is the rash located?"      *No Answer* 3. NUMBER: "How many spots are there?"      2 on right left leg 4. SIZE: "How big are the spots?" (Inches, centimeters or compare to size of a coin)      Varying penny and  5. ONSET: "When did the rash start?"      *No Answer* 6. ITCHING: "Does the rash itch?" If Yes, ask: "How bad is the itch?"  (Scale 0-10; or none, mild, moderate, severe)     moderate 7. PAIN: "Does the rash hurt?" If Yes, ask: "How bad is the pain?"  (Scale 0-10; or none, mild, moderate, severe)    - NONE (0): no pain    - MILD (1-3): doesn't interfere with normal activities     - MODERATE (4-7): interferes with normal activities or awakens from sleep     - SEVERE (8-10): excruciating pain, unable to do any normal activities     Mild  8. OTHER SYMPTOMS: "Do you have any other symptoms?" (e.g., fever)     Black spot noted no  Protocols used: Rash or Redness - Localized-A-AH

## 2022-09-06 NOTE — Telephone Encounter (Signed)
noted 

## 2022-09-09 ENCOUNTER — Encounter (HOSPITAL_BASED_OUTPATIENT_CLINIC_OR_DEPARTMENT_OTHER): Payer: Self-pay

## 2022-09-09 ENCOUNTER — Emergency Department (HOSPITAL_BASED_OUTPATIENT_CLINIC_OR_DEPARTMENT_OTHER)
Admission: EM | Admit: 2022-09-09 | Discharge: 2022-09-09 | Disposition: A | Discharge status: Home / Self Care | Payer: Medicaid Other | Source: Home / Self Care | Attending: Emergency Medicine | Admitting: Emergency Medicine

## 2022-09-09 ENCOUNTER — Other Ambulatory Visit: Payer: Self-pay

## 2022-09-09 DIAGNOSIS — R531 Weakness: Secondary | ICD-10-CM | POA: Insufficient documentation

## 2022-09-09 DIAGNOSIS — R5383 Other fatigue: Secondary | ICD-10-CM | POA: Insufficient documentation

## 2022-09-09 DIAGNOSIS — J45909 Unspecified asthma, uncomplicated: Secondary | ICD-10-CM | POA: Insufficient documentation

## 2022-09-09 DIAGNOSIS — F17200 Nicotine dependence, unspecified, uncomplicated: Secondary | ICD-10-CM | POA: Diagnosis not present

## 2022-09-09 DIAGNOSIS — Z8616 Personal history of COVID-19: Secondary | ICD-10-CM | POA: Insufficient documentation

## 2022-09-09 DIAGNOSIS — R519 Headache, unspecified: Secondary | ICD-10-CM | POA: Insufficient documentation

## 2022-09-09 MED ORDER — ACETAMINOPHEN 500 MG PO TABS
1000.0000 mg | ORAL_TABLET | Freq: Once | ORAL | Status: AC
  Administered 2022-09-09: 1000 mg via ORAL
  Filled 2022-09-09: qty 2

## 2022-09-09 NOTE — ED Triage Notes (Signed)
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 Reports car has no a/c, felt like she was overheating and had to stop working. Pt states she was drinking water throughout day. Also reporting dizziness earlier today that has now resolved.

## 2022-09-09 NOTE — ED Notes (Signed)
Pt given gatorade per request.

## 2022-09-09 NOTE — ED Provider Notes (Signed)
[place] EMERGENCY DEPARTMENT AT [facility] Provider Note  CSN: [id number] Arrival date & time: 09/09/22 0139  Chief Complaint(s) Weakness and Headache   HPI Rachael Tanner is a 22 y.o. female with a past medical history listed below who presents to the emergency department for generalized fatigue and weakness.  Patient also endorsing of headache.  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  Reports that she has been trying to hydrate but felt that she might be overheating.  Patient went home and tried to rest but continued to have symptoms.  States that she took a hot shower and felt worse afterwards.  She denies any recent fevers or infections.  No coughing or congestion.  No nausea or vomiting.  No diarrhea.  No chest pain or shortness of breath.  No other physical complaints.  The history is provided by the patient.    Past Medical History Past Medical History:  Diagnosis Date   Anxiety    Anxiety    Asthma    COVID-19    Marijuana use    Palpitations    STD (female)    Tobacco abuse    Patient Active Problem List   Diagnosis Date Noted   Polycythemia 03/29/2020   Smoker 03/29/2020   PVC (premature ventricular contraction) 02/18/2020   Non-cardiac chest pain 11/25/2019   Palpitations 11/25/2019   Shortness of breath 11/25/2019   Home Medication(s) Prior to Admission medications   Medication Sig Start Date End Date Taking? Authorizing Provider  albuterol (VENTOLIN HFA) 108 (90 Base) MCG/ACT inhaler Inhale 1-2 puffs into the lungs every 6 (six) hours as needed for wheezing or shortness of breath. 05/19/22   Carlisle Beers, FNP  cetirizine (ZYRTEC) 10 MG tablet Take 10 mg by mouth daily. 12/11/21   [provider]  metroNIDAZOLE (FLAGYL) 500 MG tablet Take 1 tablet (500 mg total) by mouth 2 (two) times daily. 04/30/22   Merrilee Jansky, MD  promethazine-dextromethorphan (PROMETHAZINE-DM) 6.25-15  MG/5ML syrup Take 5 mLs by mouth at bedtime as needed for cough. 05/19/22   Carlisle Beers, FNP                                                                                                                                    Allergies Patient has no known allergies.  Review of Systems Review of Systems As noted in HPI  Physical Exam Vital Signs  I have reviewed the triage vital signs BP 114/75   Pulse 61   Temp 98.7 F (37.1 C) (Oral)   Resp 18   Ht 5\' 6"  (1.676 m)   Wt 68 kg   SpO2 99%   BMI 24.21 kg/m   Physical Exam Vitals reviewed.  Constitutional:      General: She is not in acute distress.    Appearance: She is well-developed. She is not  diaphoretic.  HENT:     Head: Normocephalic and atraumatic.     Nose: Nose normal.  Eyes:     General: No scleral icterus.       Right eye: No discharge.        Left eye: No discharge.     Conjunctiva/sclera: Conjunctivae normal.     Pupils: Pupils are equal, round, and reactive to light.  Cardiovascular:     Rate and Rhythm: Normal rate and regular rhythm.     Heart sounds: No murmur heard.    No friction rub. No gallop.  Pulmonary:     Effort: Pulmonary effort is normal. No respiratory distress.     Breath sounds: Normal breath sounds. No stridor. No rales.  Abdominal:     General: There is no distension.     Palpations: Abdomen is soft.     Tenderness: There is no abdominal tenderness.  Musculoskeletal:        General: No tenderness.     Cervical back: Normal range of motion and neck supple.  Skin:    General: Skin is warm and dry.     Findings: No erythema or rash.  Neurological:     Mental Status: She is alert and oriented to person, place, and time.     Sensory: Sensation is intact.     Motor: Motor function is intact.     Coordination: Coordination is intact. Romberg sign negative.     Gait: Gait is intact.     ED Results and Treatments Labs (all labs ordered are listed, but only abnormal results  are displayed) Labs Reviewed - No data to display                                                                                                                       EKG  EKG Interpretation Date/Time:    Ventricular Rate:    PR Interval:    QRS Duration:    QT Interval:    QTC Calculation:   R Axis:      Text Interpretation:         Radiology No results found.  Medications Ordered in ED Medications  acetaminophen (TYLENOL) tablet 1,000 mg (1,000 mg Oral Given 09/09/22 0204)   Procedures Procedures  (including critical care time) Medical Decision Making / ED Course   Medical Decision Making Risk OTC drugs.    DDx considered:  Non focal neuro exam. No recent head trauma. No fever. Doubt meningitis. Doubt intracranial bleed. Doubt IIH. No indication for imaging.   Low suspicion for any electrolyte or metabolic derangements requiring labs at this time.  Likely heat exhaustion. Tolerating PO.      Final Clinical Impression(s) / ED Diagnoses Final diagnoses:  Fatigue, unspecified type  Mild headache   The patient appears reasonably screened and/or stabilized for discharge and I doubt any other medical condition or other [facility] requiring further screening, evaluation, or treatment in the ED at this time. I have discussed the  findings, Dx and Tx plan with the patient/family who expressed understanding and agree(s) with the plan. Discharge instructions discussed at length. The patient/family was given strict return precautions who verbalized understanding of the instructions. No further questions at time of discharge.  Disposition: Discharge  Condition: Good  ED Discharge Orders     None       Follow Up: Marcine Matar, MD [address] [place] [address] [place] [address] [phone]  Call  to schedule an appointment for close follow up     This chart was dictated using voice recognition software.  Despite best efforts to proofread,  errors can  occur which can change the documentation meaning.    Nira Conn, MD 09/09/22 [phone]

## 2022-09-11 ENCOUNTER — Telehealth: Payer: Self-pay

## 2022-09-11 NOTE — Transitions of Care (Post Inpatient/ED Visit) (Signed)
   09/11/2022  Name: Rachael Tanner MRN: [id number] DOB: 07/10/2000  Today's TOC FU Call Status: Today's TOC FU Call Status:: Unsuccessful Call (1st Attempt) Unsuccessful Call (1st Attempt) Date: 09/11/22  Attempted to reach the patient regarding the most recent Inpatient/ED visit.  Follow Up Plan: Additional outreach attempts will be made to reach the patient to complete the Transitions of Care (Post Inpatient/ED visit) call.   Abelino Derrick, MHA [facility]  Managed [facility] Social Worker [phone]

## 2022-09-20 DIAGNOSIS — H5213 Myopia, bilateral: Secondary | ICD-10-CM | POA: Diagnosis not present

## 2022-10-07 ENCOUNTER — Encounter (HOSPITAL_COMMUNITY): Payer: Self-pay | Admitting: Emergency Medicine

## 2022-10-07 ENCOUNTER — Ambulatory Visit (HOSPITAL_COMMUNITY)
Admission: EM | Admit: 2022-10-07 | Discharge: 2022-10-07 | Disposition: A | Discharge status: Home / Self Care | Payer: Medicaid Other | Attending: Urgent Care | Admitting: Urgent Care

## 2022-10-07 DIAGNOSIS — R067 Sneezing: Secondary | ICD-10-CM | POA: Insufficient documentation

## 2022-10-07 DIAGNOSIS — Z20822 Contact with and (suspected) exposure to covid-19: Secondary | ICD-10-CM | POA: Insufficient documentation

## 2022-10-07 DIAGNOSIS — N898 Other specified noninflammatory disorders of vagina: Secondary | ICD-10-CM | POA: Diagnosis not present

## 2022-10-07 NOTE — Discharge Instructions (Signed)
Your covid test and vaginal swab are pending. Results will be available on Mychart. We will only call if any results are positive, or if treatment is indicated. For your sneezing, you can continue your home zyrtec.  You may also consider picking up Flonase nasal spray over the counter and using one spray daily to each nostril.

## 2022-10-07 NOTE — ED Triage Notes (Signed)
Pt reports girlfriend was exposed to covid and pt wants covid test since had some sneezing. Pt not been exposed.   Wants swap to be checked for yeast infection "been yeasty" for over a week

## 2022-10-07 NOTE — ED Provider Notes (Signed)
[facility]    CSN: [id number] Arrival date & time: 10/07/22  1624      History   Chief Complaint Chief Complaint  Patient presents with   sneezing    HPI Rachael Tanner is a 22 y.o. female.   HPI  Past Medical History:  Diagnosis Date   Anxiety    Anxiety    Asthma    COVID-19    Marijuana use    Palpitations    STD (female)    Tobacco abuse     Patient Active Problem List   Diagnosis Date Noted   Polycythemia 03/29/2020   Smoker 03/29/2020   PVC (premature ventricular contraction) 02/18/2020   Non-cardiac chest pain 11/25/2019   Palpitations 11/25/2019   Shortness of breath 11/25/2019    History reviewed. No pertinent surgical history.  OB History   No obstetric history on file.      Home Medications    Prior to Admission medications   Medication Sig Start Date End Date Taking? Authorizing Provider  albuterol (VENTOLIN HFA) 108 (90 Base) MCG/ACT inhaler Inhale 1-2 puffs into the lungs every 6 (six) hours as needed for wheezing or shortness of breath. 05/19/22   Carlisle Beers, FNP  cetirizine (ZYRTEC) 10 MG tablet Take 10 mg by mouth daily. 12/11/21   [provider]  metroNIDAZOLE (FLAGYL) 500 MG tablet Take 1 tablet (500 mg total) by mouth 2 (two) times daily. 04/30/22   Merrilee Jansky, MD  promethazine-dextromethorphan (PROMETHAZINE-DM) 6.25-15 MG/5ML syrup Take 5 mLs by mouth at bedtime as needed for cough. 05/19/22   Carlisle Beers, FNP    Family History Family History  Problem Relation Age of Onset   Hypertension Mother     Social History Social History   Tobacco Use   Smoking status: Some Days    Types: Cigars   Smokeless tobacco: Never  Vaping Use   Vaping status: Never Used  Substance Use Topics   Alcohol use: Yes    Comment: "weekends"   Drug use: Not Currently    Types: Marijuana    Comment: last use May '21     Allergies   Patient has no known allergies.   Review of Systems Review  of Systems   Physical Exam Triage Vital Signs ED Triage Vitals  Encounter Vitals Group     BP 10/07/22 1725 112/68     Systolic BP Percentile --      Diastolic BP Percentile --      Pulse Rate 10/07/22 1725 73     Resp 10/07/22 1725 14     Temp 10/07/22 1725 98.9 F (37.2 C)     Temp Source 10/07/22 1725 Oral     SpO2 10/07/22 1725 98 %     Weight --      Height --      Head Circumference --      Peak Flow --      Pain Score 10/07/22 1722 0     Pain Loc --      Pain Education --      Exclude from Growth Chart --    No data found.  Updated Vital Signs BP 112/68 (BP Location: Right Arm)   Pulse 73   Temp 98.9 F (37.2 C) (Oral)   Resp 14   LMP 09/17/2022   SpO2 98%   Visual Acuity Right Eye Distance:   Left Eye Distance:   Bilateral Distance:    Right Eye Near:  Left Eye Near:    Bilateral Near:     Physical Exam   UC Treatments / Results  Labs (all labs ordered are listed, but only abnormal results are displayed) Labs Reviewed - No data to display  EKG   Radiology No results found.  Procedures Procedures (including critical care time)  Medications Ordered in UC Medications - No data to display  Initial Impression / Assessment and Plan / UC Course  I have reviewed the triage vital signs and the nursing notes.  Pertinent labs & imaging results that were available during my care of the patient were reviewed by me and considered in my medical decision making (see chart for details).     *** Final Clinical Impressions(s) / UC Diagnoses   Final diagnoses:  None   Discharge Instructions   None    ED Prescriptions   None    PDMP not reviewed this encounter.

## 2022-10-08 LAB — SARS CORONAVIRUS 2 (TAT 6-24 HRS): SARS Coronavirus 2: NEGATIVE

## 2022-10-11 LAB — CERVICOVAGINAL ANCILLARY ONLY
Bacterial Vaginitis (gardnerella): NEGATIVE
Candida Glabrata: NEGATIVE
Candida Vaginitis: NEGATIVE
Chlamydia: NEGATIVE
Comment: NEGATIVE
Comment: NEGATIVE
Comment: NEGATIVE
Comment: NEGATIVE
Comment: NEGATIVE
Comment: NORMAL
Neisseria Gonorrhea: NEGATIVE
Trichomonas: NEGATIVE

## 2023-02-08 ENCOUNTER — Ambulatory Visit (HOSPITAL_COMMUNITY)
Admission: RE | Admit: 2023-02-08 | Discharge: 2023-02-08 | Disposition: A | Discharge status: Home / Self Care | Payer: Medicaid Other | Source: Ambulatory Visit | Attending: Internal Medicine | Admitting: Internal Medicine

## 2023-02-08 DIAGNOSIS — N898 Other specified noninflammatory disorders of vagina: Secondary | ICD-10-CM | POA: Diagnosis not present

## 2023-02-08 DIAGNOSIS — L853 Xerosis cutis: Secondary | ICD-10-CM | POA: Diagnosis not present

## 2023-02-08 DIAGNOSIS — Z7251 High risk heterosexual behavior: Secondary | ICD-10-CM | POA: Diagnosis not present

## 2023-02-21 DIAGNOSIS — Z7251 High risk heterosexual behavior: Secondary | ICD-10-CM | POA: Diagnosis not present

## 2023-02-21 DIAGNOSIS — L700 Acne vulgaris: Secondary | ICD-10-CM | POA: Diagnosis not present

## 2023-02-21 DIAGNOSIS — L853 Xerosis cutis: Secondary | ICD-10-CM | POA: Diagnosis not present

## 2023-04-08 ENCOUNTER — Ambulatory Visit: Payer: Medicaid Other | Admitting: Family Medicine

## 2023-07-10 ENCOUNTER — Ambulatory Visit: Payer: Self-pay

## 2023-09-19 ENCOUNTER — Telehealth: Payer: Self-pay

## 2023-09-19 NOTE — Telephone Encounter (Signed)
 Copied from CRM #[id number]. Topic: Clinical - Medication Question >> Sep 19, 2023  4:04 PM Delon HERO wrote: Reason for CRM: Patient is calling to request a script for [facility], Prescription Plan B. Patient is reporting that she is already ovulating and does not want the plan B from the pharmacy. She is intresed in the prescription strength Plan B. Please advise

## 2023-09-21 NOTE — Telephone Encounter (Signed)
 Tried to reach pt x 3 this a.m including the home phone listed. I LVMM informing of who I am and that I was returning her call. Advise if she is still having issue, she should be seen at [facility].

## 2023-10-15 ENCOUNTER — Telehealth: Payer: Self-pay | Admitting: Internal Medicine

## 2023-10-15 NOTE — Telephone Encounter (Signed)
 Pt unconfirmed appt 9/10 lvm

## 2023-10-16 ENCOUNTER — Encounter: Payer: Self-pay | Admitting: Nurse Practitioner

## 2023-10-16 DIAGNOSIS — L309 Dermatitis, unspecified: Secondary | ICD-10-CM

## 2023-10-16 NOTE — [facility] Notes (Signed)
 No show
